# Patient Record
Sex: Male | Born: 1950 | Race: White | Hispanic: No | Smoking: Former smoker
Health system: Southern US, Community
[De-identification: ages and names within clinical notes are randomized; demographics above are authoritative.]

## PROBLEM LIST (undated history)

## (undated) ENCOUNTER — Emergency Department

## (undated) DIAGNOSIS — M1990 Unspecified osteoarthritis, unspecified site: Secondary | ICD-10-CM

## (undated) DIAGNOSIS — E781 Pure hyperglyceridemia: Secondary | ICD-10-CM

## (undated) DIAGNOSIS — K759 Inflammatory liver disease, unspecified: Secondary | ICD-10-CM

## (undated) HISTORY — PX: COLONOSCOPY: SHX174

## (undated) HISTORY — PX: BLADDER STONE REMOVAL: SHX568

## (undated) HISTORY — PX: ABDOMINAL SURGERY: SHX537

## (undated) MED FILL — Fosaprepitant Dimeglumine For IV Infusion 150 MG (Base Eq): INTRAVENOUS | Qty: 5 | Status: AC

---

## 2015-05-25 DIAGNOSIS — M545 Low back pain: Secondary | ICD-10-CM | POA: Diagnosis not present

## 2015-05-25 DIAGNOSIS — Z23 Encounter for immunization: Secondary | ICD-10-CM | POA: Diagnosis not present

## 2015-05-25 DIAGNOSIS — G8929 Other chronic pain: Secondary | ICD-10-CM | POA: Diagnosis not present

## 2015-05-25 DIAGNOSIS — Z0000 Encounter for general adult medical examination without abnormal findings: Secondary | ICD-10-CM | POA: Diagnosis not present

## 2015-05-25 DIAGNOSIS — Z125 Encounter for screening for malignant neoplasm of prostate: Secondary | ICD-10-CM | POA: Diagnosis not present

## 2015-05-29 DIAGNOSIS — E875 Hyperkalemia: Secondary | ICD-10-CM | POA: Diagnosis not present

## 2015-05-31 DIAGNOSIS — E875 Hyperkalemia: Secondary | ICD-10-CM | POA: Diagnosis not present

## 2015-05-31 DIAGNOSIS — R768 Other specified abnormal immunological findings in serum: Secondary | ICD-10-CM | POA: Diagnosis not present

## 2015-06-21 ENCOUNTER — Other Ambulatory Visit: Payer: Self-pay | Admitting: Student

## 2015-06-21 DIAGNOSIS — B182 Chronic viral hepatitis C: Secondary | ICD-10-CM

## 2015-06-21 DIAGNOSIS — Z1211 Encounter for screening for malignant neoplasm of colon: Secondary | ICD-10-CM | POA: Diagnosis not present

## 2015-06-26 ENCOUNTER — Ambulatory Visit: Payer: PPO

## 2015-06-29 ENCOUNTER — Ambulatory Visit
Admission: RE | Admit: 2015-06-29 | Discharge: 2015-06-29 | Disposition: A | Discharge status: Home / Self Care | Payer: PPO | Source: Ambulatory Visit | Attending: Student | Admitting: Student

## 2015-06-29 DIAGNOSIS — K760 Fatty (change of) liver, not elsewhere classified: Secondary | ICD-10-CM | POA: Insufficient documentation

## 2015-06-29 DIAGNOSIS — B182 Chronic viral hepatitis C: Secondary | ICD-10-CM | POA: Insufficient documentation

## 2015-06-30 DIAGNOSIS — B182 Chronic viral hepatitis C: Secondary | ICD-10-CM | POA: Diagnosis not present

## 2015-07-04 DIAGNOSIS — Z1211 Encounter for screening for malignant neoplasm of colon: Secondary | ICD-10-CM | POA: Diagnosis not present

## 2015-07-04 DIAGNOSIS — D125 Benign neoplasm of sigmoid colon: Secondary | ICD-10-CM | POA: Diagnosis not present

## 2015-07-04 DIAGNOSIS — K635 Polyp of colon: Secondary | ICD-10-CM | POA: Diagnosis not present

## 2015-07-04 DIAGNOSIS — D123 Benign neoplasm of transverse colon: Secondary | ICD-10-CM | POA: Diagnosis not present

## 2015-07-04 DIAGNOSIS — D126 Benign neoplasm of colon, unspecified: Secondary | ICD-10-CM | POA: Diagnosis not present

## 2015-07-04 DIAGNOSIS — D122 Benign neoplasm of ascending colon: Secondary | ICD-10-CM | POA: Diagnosis not present

## 2015-07-12 DIAGNOSIS — B182 Chronic viral hepatitis C: Secondary | ICD-10-CM | POA: Diagnosis not present

## 2015-07-26 DIAGNOSIS — B182 Chronic viral hepatitis C: Secondary | ICD-10-CM | POA: Diagnosis not present

## 2015-08-16 DIAGNOSIS — B182 Chronic viral hepatitis C: Secondary | ICD-10-CM | POA: Diagnosis not present

## 2015-09-13 DIAGNOSIS — B182 Chronic viral hepatitis C: Secondary | ICD-10-CM | POA: Diagnosis not present

## 2015-10-31 DIAGNOSIS — M79641 Pain in right hand: Secondary | ICD-10-CM | POA: Diagnosis not present

## 2015-10-31 DIAGNOSIS — Z23 Encounter for immunization: Secondary | ICD-10-CM | POA: Diagnosis not present

## 2015-10-31 DIAGNOSIS — M79642 Pain in left hand: Secondary | ICD-10-CM | POA: Diagnosis not present

## 2015-10-31 DIAGNOSIS — M159 Polyosteoarthritis, unspecified: Secondary | ICD-10-CM | POA: Diagnosis not present

## 2016-01-23 DIAGNOSIS — X32XXXA Exposure to sunlight, initial encounter: Secondary | ICD-10-CM | POA: Diagnosis not present

## 2016-01-23 DIAGNOSIS — D2261 Melanocytic nevi of right upper limb, including shoulder: Secondary | ICD-10-CM | POA: Diagnosis not present

## 2016-01-23 DIAGNOSIS — D2262 Melanocytic nevi of left upper limb, including shoulder: Secondary | ICD-10-CM | POA: Diagnosis not present

## 2016-01-23 DIAGNOSIS — L570 Actinic keratosis: Secondary | ICD-10-CM | POA: Diagnosis not present

## 2016-01-23 DIAGNOSIS — D2271 Melanocytic nevi of right lower limb, including hip: Secondary | ICD-10-CM | POA: Diagnosis not present

## 2016-01-23 DIAGNOSIS — D2272 Melanocytic nevi of left lower limb, including hip: Secondary | ICD-10-CM | POA: Diagnosis not present

## 2016-05-27 DIAGNOSIS — Z0000 Encounter for general adult medical examination without abnormal findings: Secondary | ICD-10-CM | POA: Diagnosis not present

## 2016-07-01 ENCOUNTER — Other Ambulatory Visit: Payer: Self-pay | Admitting: Student

## 2016-07-01 DIAGNOSIS — B182 Chronic viral hepatitis C: Secondary | ICD-10-CM | POA: Diagnosis not present

## 2016-07-05 ENCOUNTER — Ambulatory Visit
Admission: RE | Admit: 2016-07-05 | Discharge: 2016-07-05 | Disposition: A | Discharge status: Home / Self Care | Payer: PPO | Source: Ambulatory Visit | Attending: Student | Admitting: Student

## 2016-07-05 DIAGNOSIS — B1920 Unspecified viral hepatitis C without hepatic coma: Secondary | ICD-10-CM | POA: Diagnosis not present

## 2016-07-05 DIAGNOSIS — B182 Chronic viral hepatitis C: Secondary | ICD-10-CM | POA: Diagnosis not present

## 2016-11-25 DIAGNOSIS — M159 Polyosteoarthritis, unspecified: Secondary | ICD-10-CM | POA: Diagnosis not present

## 2016-11-25 DIAGNOSIS — Z23 Encounter for immunization: Secondary | ICD-10-CM | POA: Diagnosis not present

## 2017-01-22 DIAGNOSIS — X32XXXA Exposure to sunlight, initial encounter: Secondary | ICD-10-CM | POA: Diagnosis not present

## 2017-01-22 DIAGNOSIS — D2262 Melanocytic nevi of left upper limb, including shoulder: Secondary | ICD-10-CM | POA: Diagnosis not present

## 2017-01-22 DIAGNOSIS — D225 Melanocytic nevi of trunk: Secondary | ICD-10-CM | POA: Diagnosis not present

## 2017-01-22 DIAGNOSIS — L821 Other seborrheic keratosis: Secondary | ICD-10-CM | POA: Diagnosis not present

## 2017-05-26 DIAGNOSIS — Z0000 Encounter for general adult medical examination without abnormal findings: Secondary | ICD-10-CM | POA: Diagnosis not present

## 2017-05-28 DIAGNOSIS — E7800 Pure hypercholesterolemia, unspecified: Secondary | ICD-10-CM | POA: Diagnosis not present

## 2017-05-28 DIAGNOSIS — Z0000 Encounter for general adult medical examination without abnormal findings: Secondary | ICD-10-CM | POA: Diagnosis not present

## 2017-06-03 IMAGING — US US [PERSON_NAME]
1 series · 13 of 14 positions shown · non-contrast
Comparison: None.

CLINICAL DATA: Chronic hepatitis-C.



[Series 1: us (person_name) · 0.23mm/px · 13 of 14 slices shown]
[im 1/14]
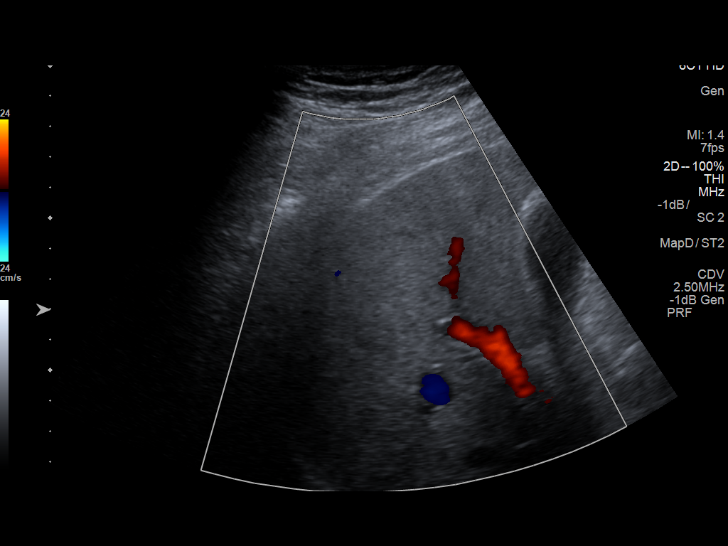
[im 2/14]
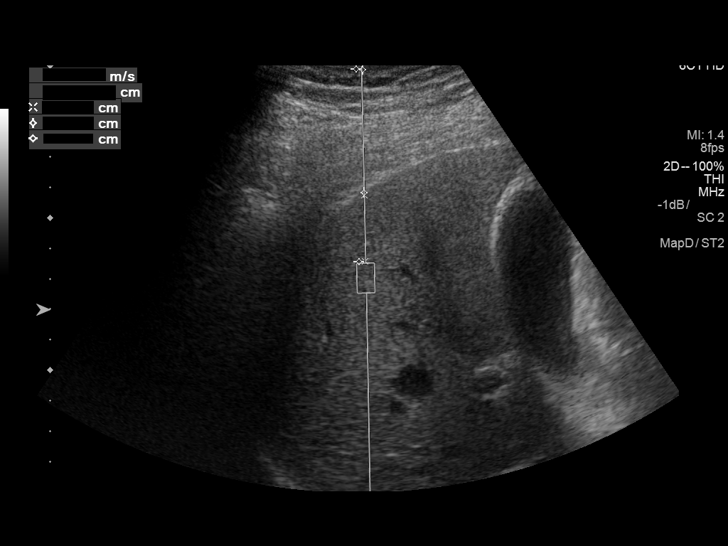
[im 3/14]
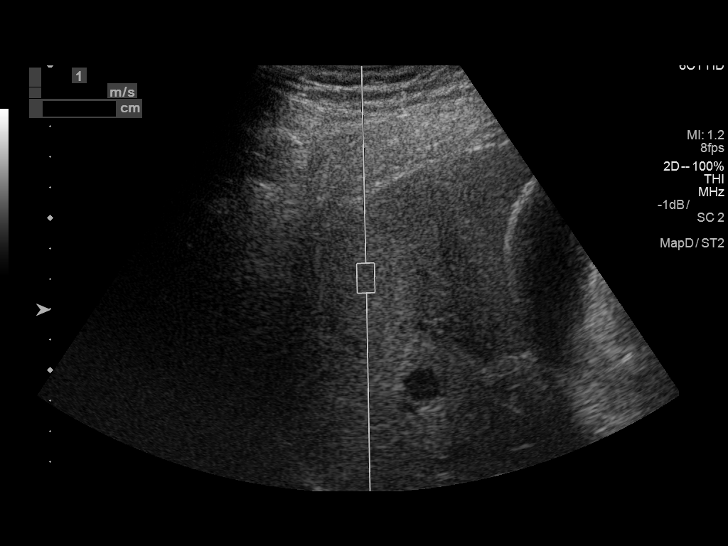
[im 4/14]
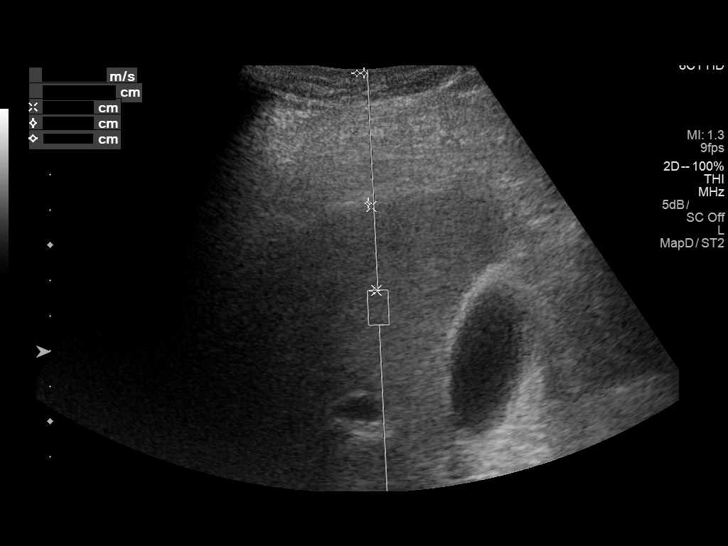
[im 5/14]
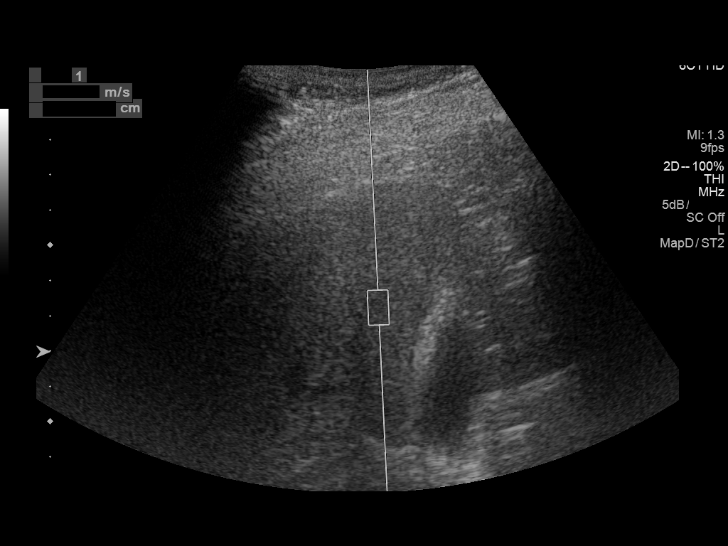
[im 6/14]
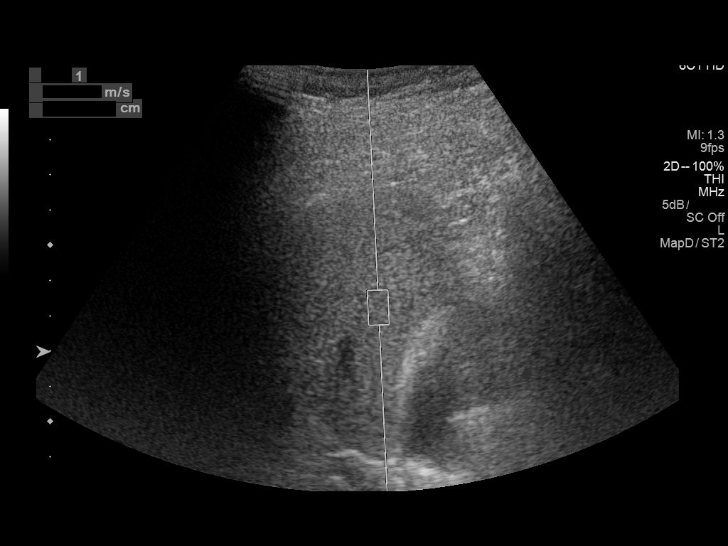
[im 8/14]
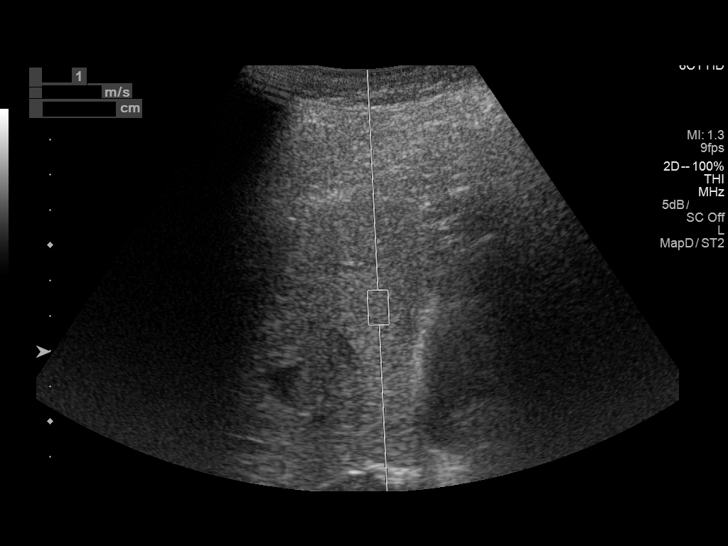
[im 9/14]
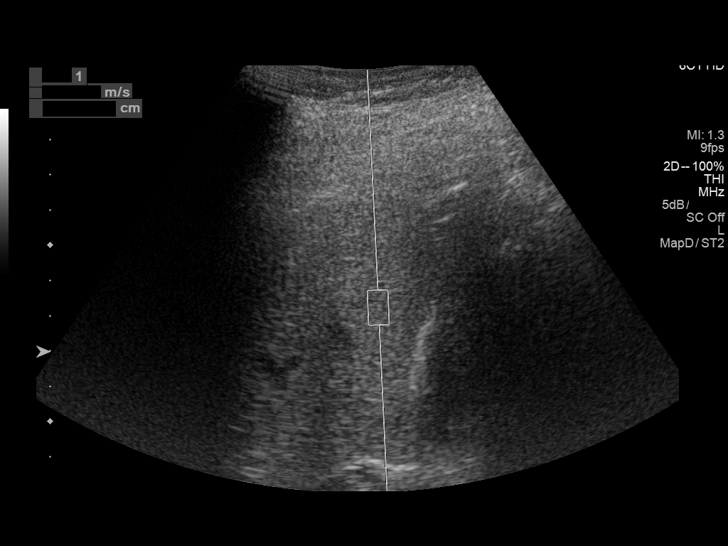
[im 10/14]
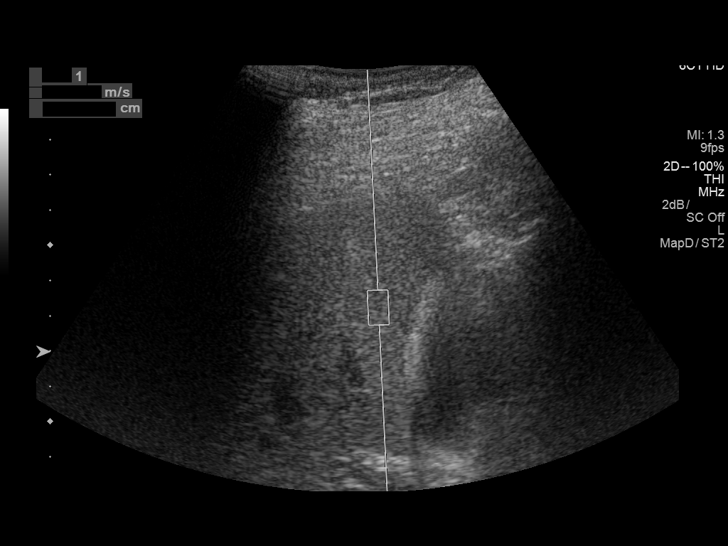
[im 11/14]
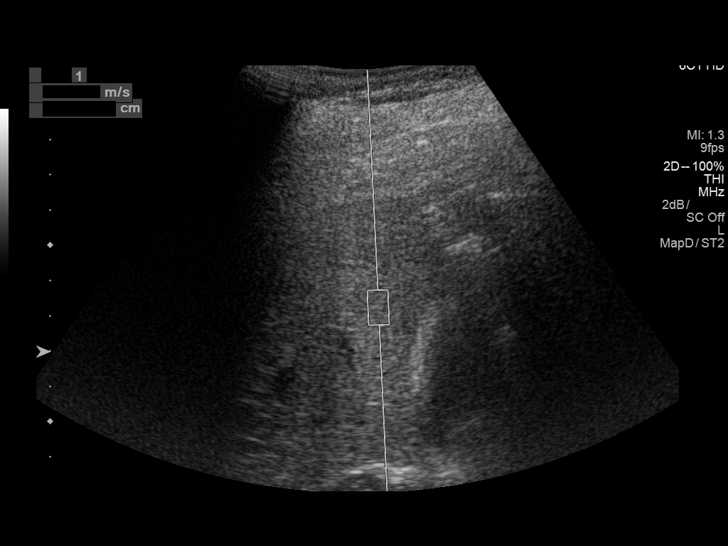
[im 12/14]
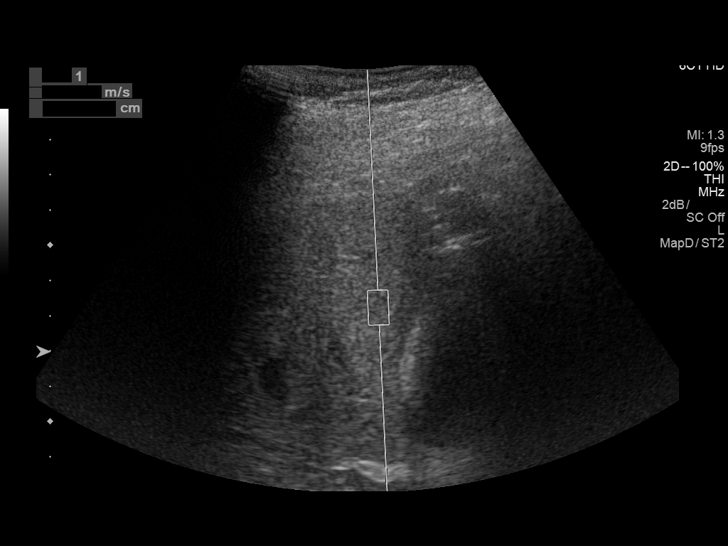
[im 13/14]
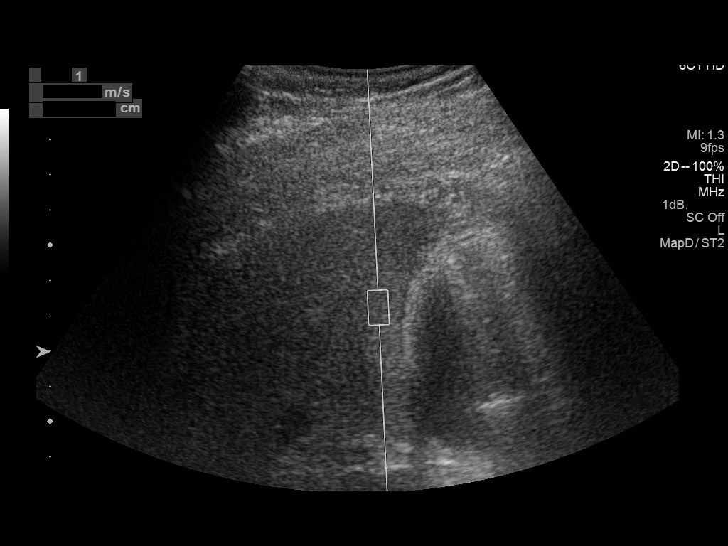
[im 14/14]
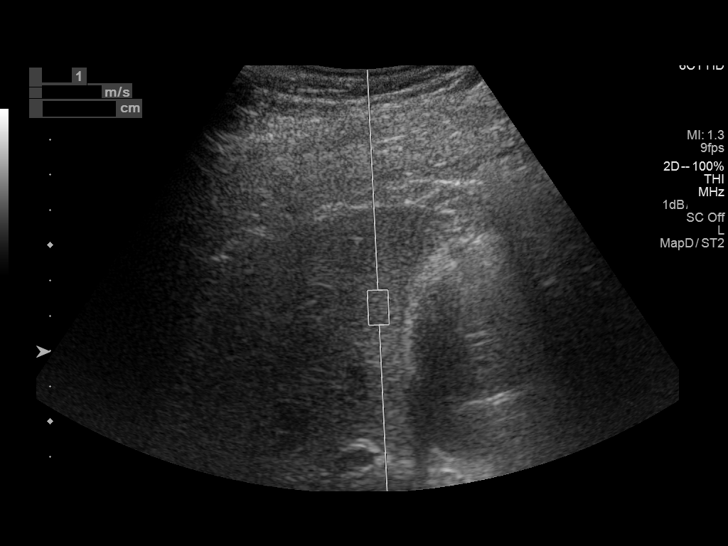

[13 of 14 positions shown; findings below may reference images not displayed]

FINDINGS: ULTRASOUND ABDOMEN

Gallbladder: No gallstones or wall thickening visualized. No
sonographic Murphy sign noted by sonographer.

Common bile duct: Diameter: 3 mm

Liver: Liver parenchyma is diffusely mildly echogenic with mild
posterior acoustic attenuation, in keeping with mild diffuse hepatic
steatosis. No liver surface irregularity or liver mass are
demonstrated, noting decreased sensitivity for a liver mass in the
setting of an echogenic liver.

IVC: No abnormality visualized.

Pancreas: Obscured by significant overlying bowel gas.

Spleen: Size and appearance within normal limits.

Right Kidney: Length: 10.1 cm. Echogenicity within normal limits. No
mass or hydronephrosis visualized.

Left Kidney: Length: 11.6 cm. Echogenicity within normal limits. No
mass or hydronephrosis visualized.

Abdominal aorta: No aneurysm visualized.

Other findings: None.

ULTRASOUND HEPATIC ELASTOGRAPHY

Device: Siemens Helix VTQ

Patient position:  Left Lateral Decubitus

Transducer 6C1

Number of measurements:  10

Hepatic Segment:  8

Median velocity:   0.85  m/sec

IQR:

IQR/Median velocity ratio 0.2 cm

Corresponding Metavir fibrosis score:  F0/F1

Risk of fibrosis: Minimal

Limitations of exam: None

Pertinent findings noted on other imaging exams:  None

Please note that abnormal shear wave velocities may also be
identified in clinical settings other than with hepatic fibrosis,
such as: acute hepatitis, elevated right heart and central venous
pressures including use of beta blockers, Zapeta disease
(Yannick), infiltrative processes such as
mastocytosis/amyloidosis/infiltrative tumor, extrahepatic
cholestasis, in the post-prandial state, and liver transplantation.
Correlation with patient history, laboratory data, and clinical
condition recommended.
IMPRESSION: 1. Mild diffuse hepatic steatosis. No macroscopic evidence of
cirrhosis. No liver mass .
2. Otherwise normal abdominal sonogram.
3. Hepatic elastography results:
Median hepatic shear wave velocity is calculated at 0.85 m/sec.

Corresponding Metavir fibrosis score is F0/F1.

Risk of fibrosis is minimal.

Follow-up:  None required.

## 2017-09-29 DIAGNOSIS — E7800 Pure hypercholesterolemia, unspecified: Secondary | ICD-10-CM | POA: Diagnosis not present

## 2017-10-06 DIAGNOSIS — E781 Pure hyperglyceridemia: Secondary | ICD-10-CM | POA: Diagnosis not present

## 2017-10-06 DIAGNOSIS — M159 Polyosteoarthritis, unspecified: Secondary | ICD-10-CM | POA: Diagnosis not present

## 2018-01-22 DIAGNOSIS — D2271 Melanocytic nevi of right lower limb, including hip: Secondary | ICD-10-CM | POA: Diagnosis not present

## 2018-01-22 DIAGNOSIS — X32XXXA Exposure to sunlight, initial encounter: Secondary | ICD-10-CM | POA: Diagnosis not present

## 2018-01-22 DIAGNOSIS — D2272 Melanocytic nevi of left lower limb, including hip: Secondary | ICD-10-CM | POA: Diagnosis not present

## 2018-01-22 DIAGNOSIS — D2261 Melanocytic nevi of right upper limb, including shoulder: Secondary | ICD-10-CM | POA: Diagnosis not present

## 2018-01-22 DIAGNOSIS — L570 Actinic keratosis: Secondary | ICD-10-CM | POA: Diagnosis not present

## 2018-01-22 DIAGNOSIS — D2262 Melanocytic nevi of left upper limb, including shoulder: Secondary | ICD-10-CM | POA: Diagnosis not present

## 2018-01-22 DIAGNOSIS — L821 Other seborrheic keratosis: Secondary | ICD-10-CM | POA: Diagnosis not present

## 2018-01-22 DIAGNOSIS — D225 Melanocytic nevi of trunk: Secondary | ICD-10-CM | POA: Diagnosis not present

## 2018-04-06 DIAGNOSIS — E781 Pure hyperglyceridemia: Secondary | ICD-10-CM | POA: Diagnosis not present

## 2018-04-06 DIAGNOSIS — M159 Polyosteoarthritis, unspecified: Secondary | ICD-10-CM | POA: Diagnosis not present

## 2018-04-13 DIAGNOSIS — Z0000 Encounter for general adult medical examination without abnormal findings: Secondary | ICD-10-CM | POA: Diagnosis not present

## 2018-04-13 DIAGNOSIS — M159 Polyosteoarthritis, unspecified: Secondary | ICD-10-CM | POA: Diagnosis not present

## 2018-04-13 DIAGNOSIS — E781 Pure hyperglyceridemia: Secondary | ICD-10-CM | POA: Diagnosis not present

## 2018-06-10 IMAGING — US US ABDOMEN COMPLETE
1 series · 14 of 25 positions shown · non-contrast
Comparison: None.

CLINICAL DATA: Hepatitis-C.

EXAM:
ABDOMEN ULTRASOUND COMPLETE

[Series 1: us abdomen complete · 0.23mm/px · 14 of 106 slices shown]
[im 1/106]
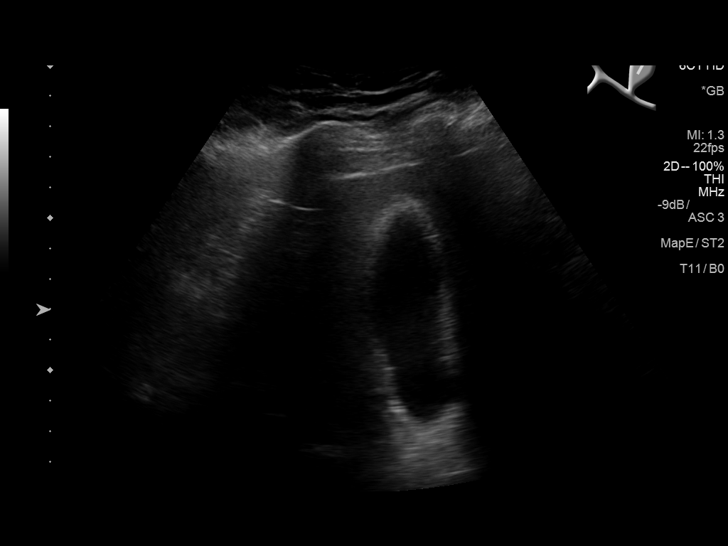
[im 9/106]
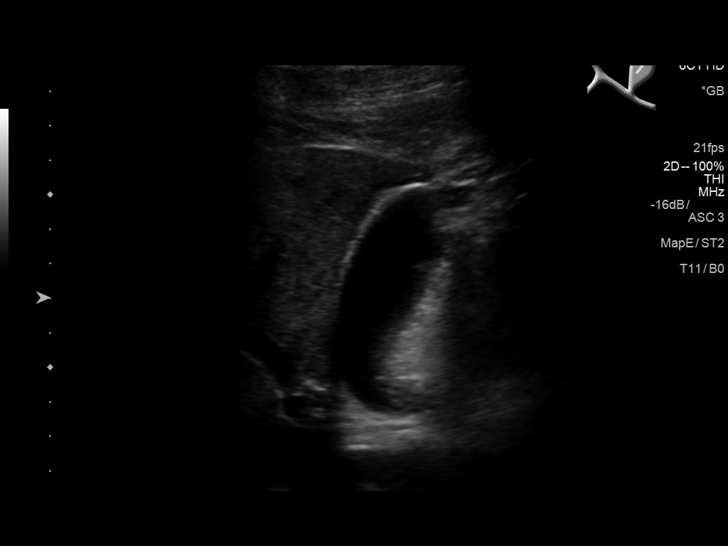
[im 18/106]
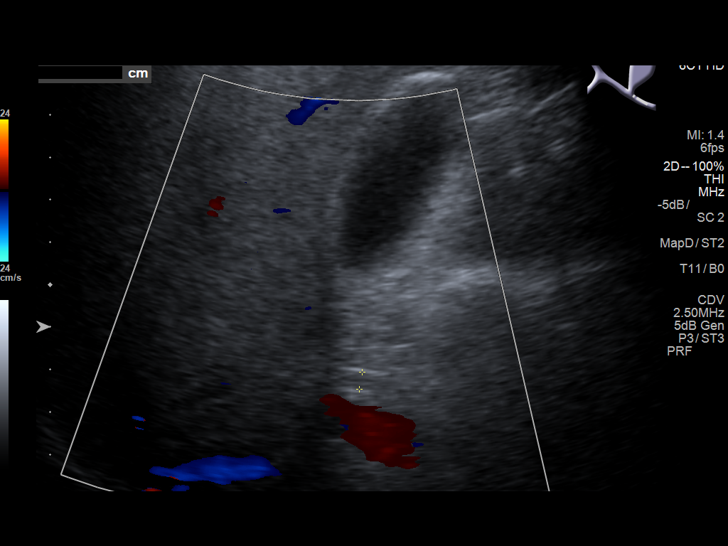
[im 27/106]
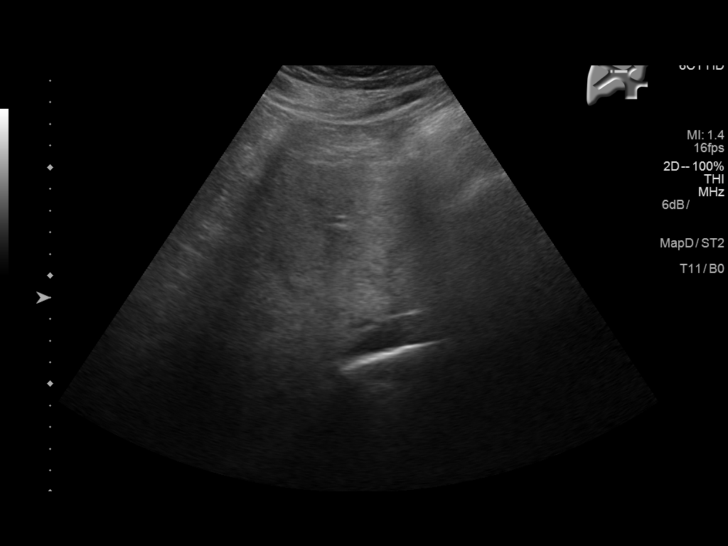
[im 36/106]
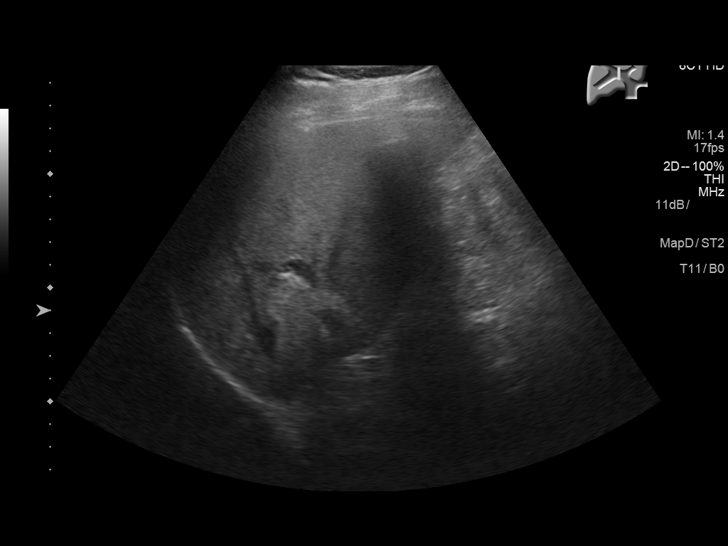
[im 40/106]
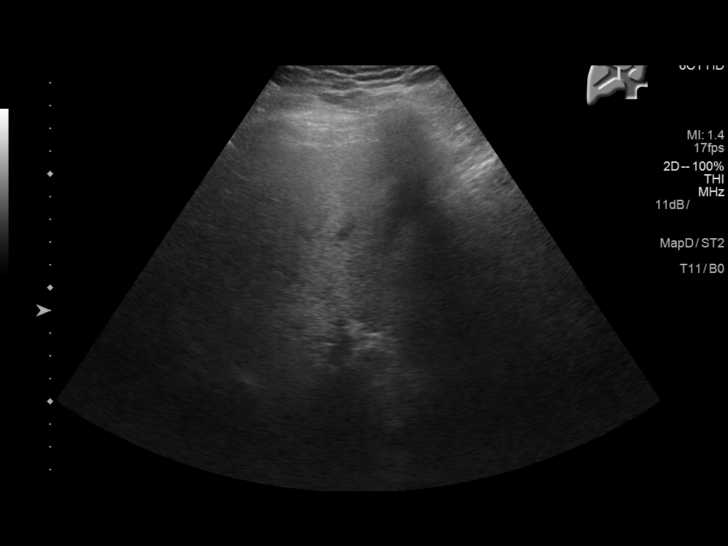
[im 49/106]
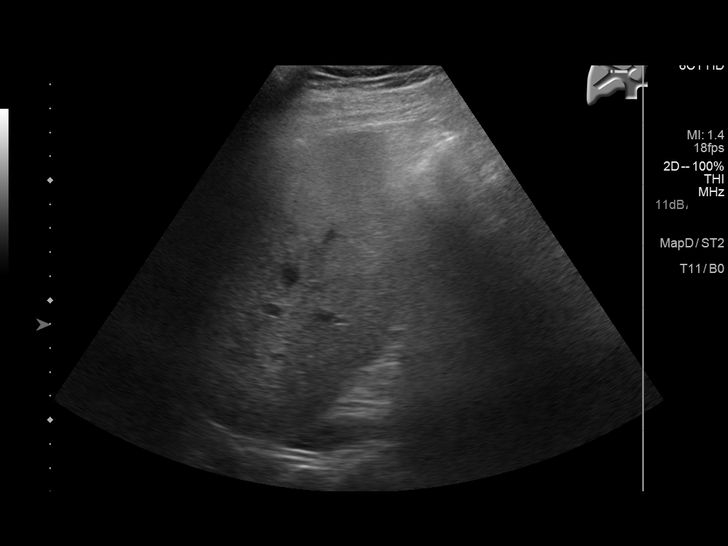
[im 57/106]
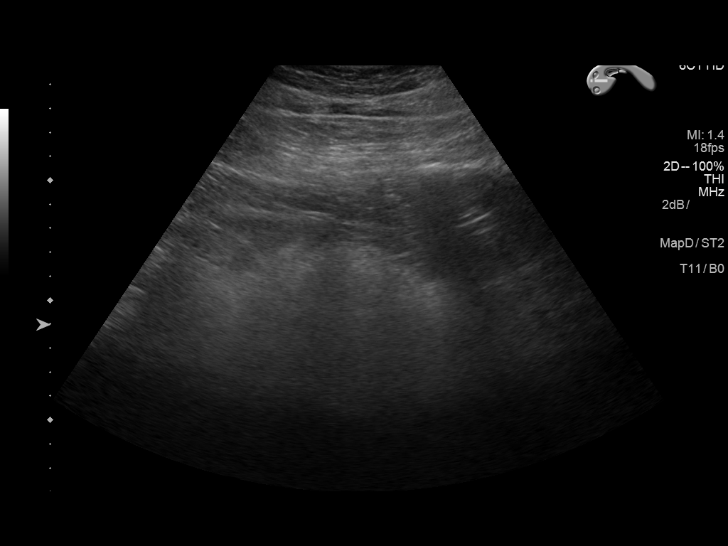
[im 66/106]
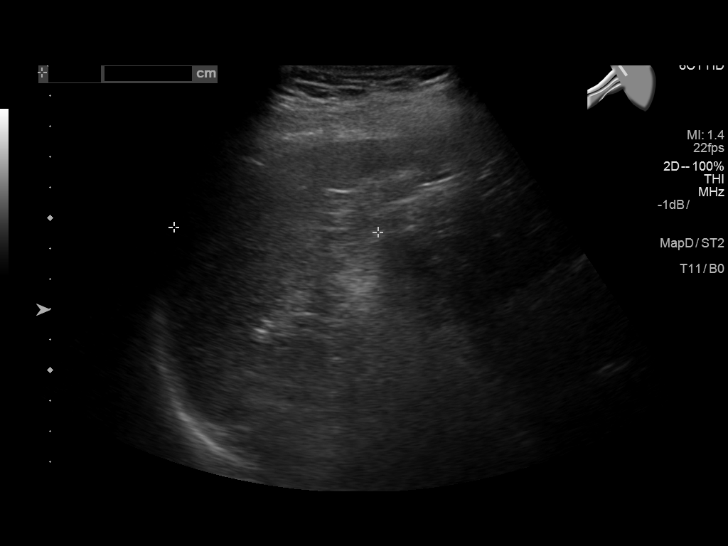
[im 71/106]
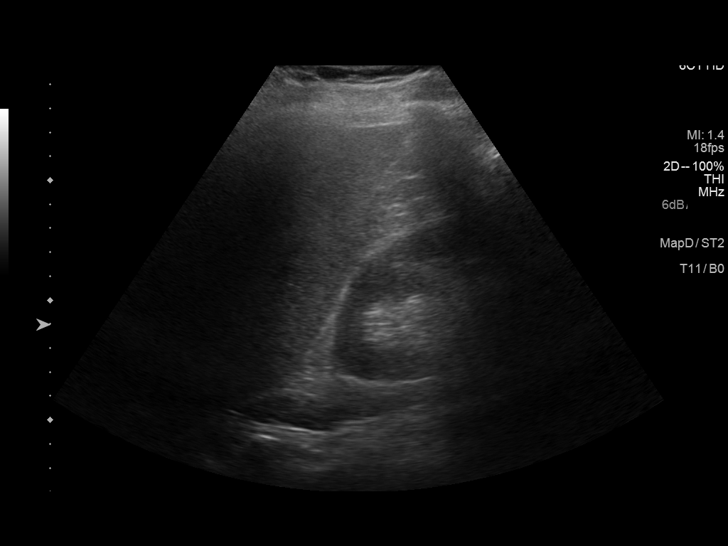
[im 79/106]
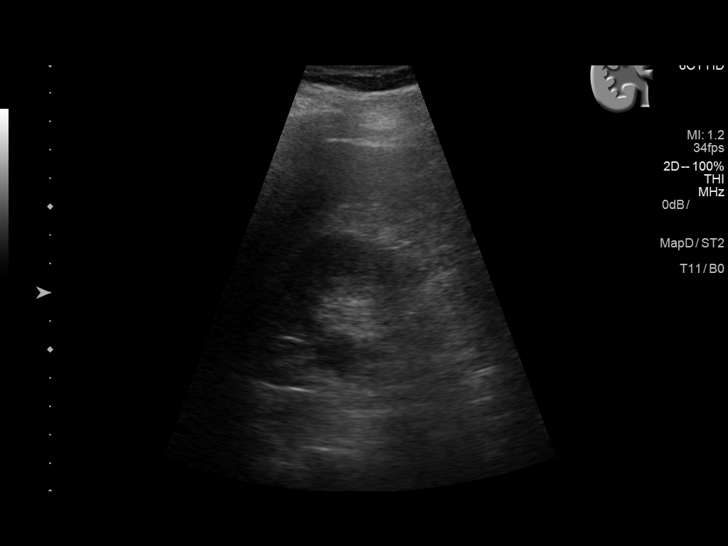
[im 88/106]
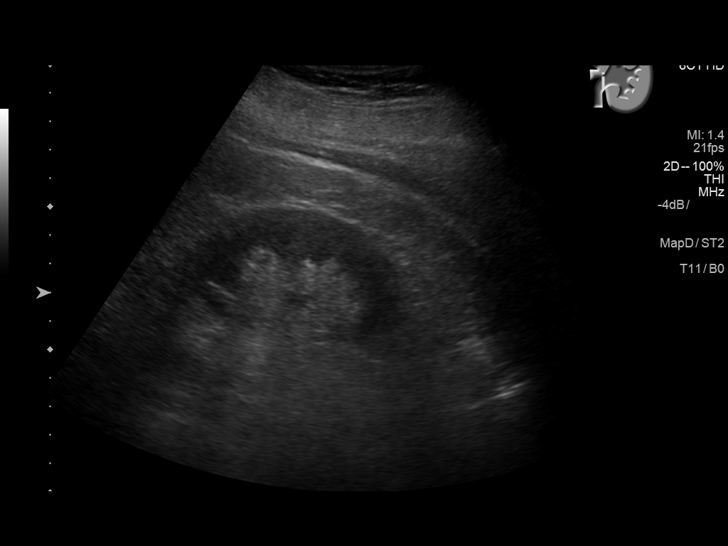
[im 97/106]
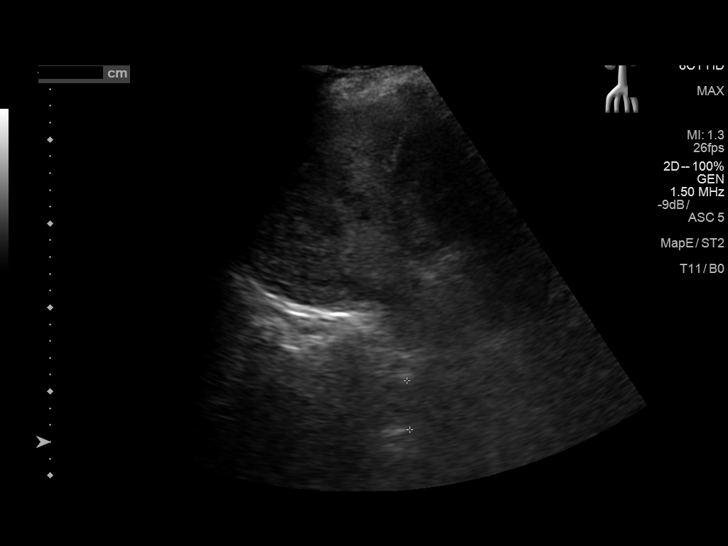
[im 106/106]
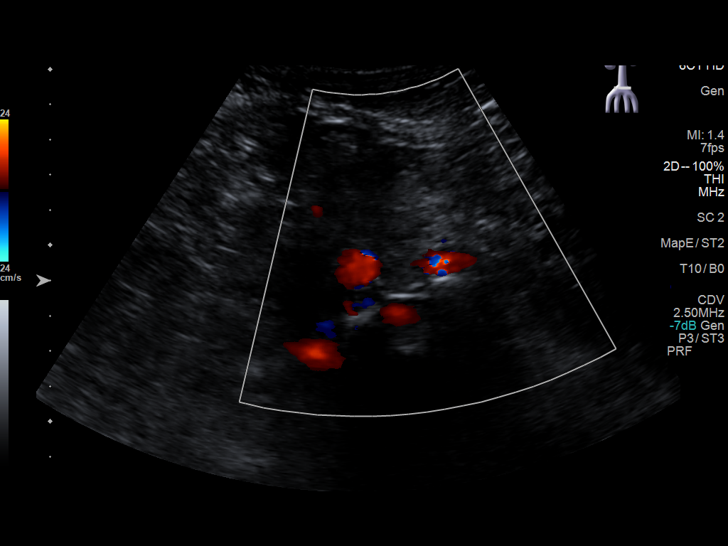

[14 of 25 positions shown; findings below may reference images not displayed]

FINDINGS: Gallbladder: No gallstones or wall thickening visualized. No
sonographic Murphy sign noted by sonographer.

Common bile duct: Diameter: 4.2 mm, normal.

Liver: No focal lesion identified. Slight diffuse increased
echogenicity of the liver parenchyma, unchanged.

IVC: No abnormality visualized.

Pancreas: Visualized portion unremarkable.

Spleen: Size and appearance within normal limits.

Right Kidney: Length: 10.6 cm . Echogenicity within normal limits.
No mass or hydronephrosis visualized.

Left Kidney: Length: 11.1 cm. Echogenicity within normal limits. No
mass or hydronephrosis visualized.

Abdominal aorta: No aneurysm visualized.  Aortic atherosclerosis.

Other findings: None.
IMPRESSION: No acute abnormality.  No focal liver lesions.

## 2018-08-28 DIAGNOSIS — Z8619 Personal history of other infectious and parasitic diseases: Secondary | ICD-10-CM | POA: Diagnosis not present

## 2018-08-28 DIAGNOSIS — Z86010 Personal history of colonic polyps: Secondary | ICD-10-CM | POA: Diagnosis not present

## 2018-10-08 DIAGNOSIS — E781 Pure hyperglyceridemia: Secondary | ICD-10-CM | POA: Diagnosis not present

## 2018-10-08 DIAGNOSIS — M159 Polyosteoarthritis, unspecified: Secondary | ICD-10-CM | POA: Diagnosis not present

## 2018-10-15 ENCOUNTER — Other Ambulatory Visit: Payer: Self-pay | Admitting: Family Medicine

## 2018-10-15 DIAGNOSIS — E781 Pure hyperglyceridemia: Secondary | ICD-10-CM | POA: Diagnosis not present

## 2018-10-15 DIAGNOSIS — Z87891 Personal history of nicotine dependence: Secondary | ICD-10-CM | POA: Diagnosis not present

## 2018-10-15 DIAGNOSIS — Z0000 Encounter for general adult medical examination without abnormal findings: Secondary | ICD-10-CM

## 2018-10-16 ENCOUNTER — Other Ambulatory Visit
Admission: RE | Admit: 2018-10-16 | Discharge: 2018-10-16 | Disposition: A | Discharge status: Home / Self Care | Payer: PPO | Source: Ambulatory Visit | Attending: Internal Medicine | Admitting: Internal Medicine

## 2018-10-16 ENCOUNTER — Other Ambulatory Visit: Payer: Self-pay

## 2018-10-16 DIAGNOSIS — Z20828 Contact with and (suspected) exposure to other viral communicable diseases: Secondary | ICD-10-CM | POA: Diagnosis not present

## 2018-10-16 DIAGNOSIS — Z86010 Personal history of colonic polyps: Secondary | ICD-10-CM | POA: Insufficient documentation

## 2018-10-16 DIAGNOSIS — Z01812 Encounter for preprocedural laboratory examination: Secondary | ICD-10-CM | POA: Insufficient documentation

## 2018-10-16 LAB — SARS CORONAVIRUS 2 (TAT 6-24 HRS): SARS Coronavirus 2: NEGATIVE

## 2018-10-20 ENCOUNTER — Encounter: Payer: Self-pay | Admitting: *Deleted

## 2018-10-21 ENCOUNTER — Ambulatory Visit: Payer: PPO | Admitting: Certified Registered Nurse Anesthetist

## 2018-10-21 ENCOUNTER — Ambulatory Visit
Admission: RE | Admit: 2018-10-21 | Discharge: 2018-10-21 | Disposition: A | Discharge status: Home / Self Care | Payer: PPO | Attending: Internal Medicine | Admitting: Internal Medicine

## 2018-10-21 ENCOUNTER — Encounter: Payer: Self-pay | Admitting: *Deleted

## 2018-10-21 ENCOUNTER — Encounter
Admission: RE | Disposition: A | Discharge status: Home / Self Care | Payer: Self-pay | Source: Home / Self Care | Attending: Internal Medicine

## 2018-10-21 ENCOUNTER — Other Ambulatory Visit: Payer: Self-pay

## 2018-10-21 DIAGNOSIS — Z1211 Encounter for screening for malignant neoplasm of colon: Secondary | ICD-10-CM | POA: Diagnosis not present

## 2018-10-21 DIAGNOSIS — K5730 Diverticulosis of large intestine without perforation or abscess without bleeding: Secondary | ICD-10-CM | POA: Diagnosis not present

## 2018-10-21 DIAGNOSIS — E781 Pure hyperglyceridemia: Secondary | ICD-10-CM | POA: Diagnosis not present

## 2018-10-21 DIAGNOSIS — Z8619 Personal history of other infectious and parasitic diseases: Secondary | ICD-10-CM | POA: Insufficient documentation

## 2018-10-21 DIAGNOSIS — Z86010 Personal history of colonic polyps: Secondary | ICD-10-CM | POA: Diagnosis not present

## 2018-10-21 DIAGNOSIS — K648 Other hemorrhoids: Secondary | ICD-10-CM | POA: Diagnosis not present

## 2018-10-21 DIAGNOSIS — M1990 Unspecified osteoarthritis, unspecified site: Secondary | ICD-10-CM | POA: Diagnosis not present

## 2018-10-21 DIAGNOSIS — D120 Benign neoplasm of cecum: Secondary | ICD-10-CM | POA: Insufficient documentation

## 2018-10-21 DIAGNOSIS — Z791 Long term (current) use of non-steroidal anti-inflammatories (NSAID): Secondary | ICD-10-CM | POA: Diagnosis not present

## 2018-10-21 DIAGNOSIS — K640 First degree hemorrhoids: Secondary | ICD-10-CM | POA: Diagnosis not present

## 2018-10-21 DIAGNOSIS — K635 Polyp of colon: Secondary | ICD-10-CM | POA: Diagnosis not present

## 2018-10-21 DIAGNOSIS — K5790 Diverticulosis of intestine, part unspecified, without perforation or abscess without bleeding: Secondary | ICD-10-CM | POA: Diagnosis not present

## 2018-10-21 HISTORY — DX: Pure hyperglyceridemia: E78.1

## 2018-10-21 HISTORY — DX: Inflammatory liver disease, unspecified: K75.9

## 2018-10-21 HISTORY — DX: Unspecified osteoarthritis, unspecified site: M19.90

## 2018-10-21 HISTORY — PX: COLONOSCOPY WITH PROPOFOL: SHX5780

## 2018-10-21 SURGERY — COLONOSCOPY WITH PROPOFOL
Anesthesia: General

## 2018-10-21 MED ORDER — PROPOFOL 10 MG/ML IV BOLUS
INTRAVENOUS | Status: DC | PRN
  Administered 2018-10-21 (×2): 40 mg via INTRAVENOUS
  Administered 2018-10-21: 30 mg via INTRAVENOUS
  Administered 2018-10-21: 100 mg via INTRAVENOUS
  Administered 2018-10-21: 30 mg via INTRAVENOUS

## 2018-10-21 MED ORDER — LIDOCAINE HCL (PF) 1 % IJ SOLN
INTRAMUSCULAR | Status: AC
  Filled 2018-10-21: qty 2

## 2018-10-21 MED ORDER — PHENYLEPHRINE HCL (PRESSORS) 10 MG/ML IV SOLN
INTRAVENOUS | Status: DC | PRN
  Administered 2018-10-21 (×2): 100 ug via INTRAVENOUS

## 2018-10-21 MED ORDER — PROPOFOL 10 MG/ML IV BOLUS
INTRAVENOUS | Status: AC
  Filled 2018-10-21: qty 80

## 2018-10-21 MED ORDER — LIDOCAINE HCL (CARDIAC) PF 100 MG/5ML IV SOSY
PREFILLED_SYRINGE | INTRAVENOUS | Status: DC | PRN
  Administered 2018-10-21: 100 mg via INTRAVENOUS

## 2018-10-21 MED ORDER — SODIUM CHLORIDE 0.9 % IV SOLN
INTRAVENOUS | Status: DC
  Administered 2018-10-21: 09:00:00 via INTRAVENOUS

## 2018-10-21 NOTE — Anesthesia Preprocedure Evaluation (Signed)
Anesthesia Evaluation  Patient identified by MRN, date of birth, ID band Patient awake    Reviewed: Allergy & Precautions, H&P , NPO status , Patient's Chart, lab work & pertinent test results, reviewed documented beta blocker date and time   Airway Mallampati: II   Neck ROM: full    Dental  (+) Poor Dentition   Pulmonary neg pulmonary ROS, former smoker,    Pulmonary exam normal        Cardiovascular Exercise Tolerance: Good negative cardio ROS Normal cardiovascular exam Rhythm:regular Rate:Normal     Neuro/Psych negative neurological ROS  negative psych ROS   GI/Hepatic negative GI ROS, (+) Hepatitis -  Endo/Other  negative endocrine ROS  Renal/GU negative Renal ROS  negative genitourinary   Musculoskeletal   Abdominal   Peds  Hematology negative hematology ROS (+)   Anesthesia Other Findings Past Medical History: No date: Arthritis No date: Hepatitis     Comment:  HX.OF HEP C No date: Hypertriglyceridemia Past Surgical History: No date: BLADDER STONE REMOVAL     Comment:  AGE 68 No date: COLONOSCOPY BMI    Body Mass Index: 28.35 kg/m     Reproductive/Obstetrics negative OB ROS                             Anesthesia Physical Anesthesia Plan  ASA: II  Anesthesia Plan: General   Post-op Pain Management:    Induction:   PONV Risk Score and Plan:   Airway Management Planned:   Additional Equipment:   Intra-op Plan:   Post-operative Plan:   Informed Consent: I have reviewed the patients History and Physical, chart, labs and discussed the procedure including the risks, benefits and alternatives for the proposed anesthesia with the patient or authorized representative who has indicated his/her understanding and acceptance.     Dental Advisory Given  Plan Discussed with: CRNA  Anesthesia Plan Comments:         Anesthesia Quick Evaluation

## 2018-10-21 NOTE — Interval H&P Note (Signed)
History and Physical Interval Note:  10/21/2018 9:16 AM  Curtis Cooper  has presented today for surgery, with the diagnosis of PERSONAL HX.OF COLON POLYPS.  The various methods of treatment have been discussed with the patient and family. After consideration of risks, benefits and other options for treatment, the patient has consented to  Procedure(s): COLONOSCOPY WITH PROPOFOL (N/A) as a surgical intervention.  The patient's history has been reviewed, patient examined, no change in status, stable for surgery.  I have reviewed the patient's chart and labs.  Questions were answered to the patient's satisfaction.     [place], [place]

## 2018-10-21 NOTE — Anesthesia Post-op Follow-up Note (Signed)
Anesthesia QCDR form completed.        

## 2018-10-21 NOTE — Op Note (Signed)
[facility] Gastroenterology Patient Name: Curtis Cooper Procedure Date: 10/21/2018 9:15 AM MRN: [id number] Account #: [id number] Date of Birth: 21-May-1950 Admit Type: Outpatient Age: 68 Room: [facility] ENDO ROOM 3 Gender: Male Note Status: Finalized Procedure:            Colonoscopy Indications:          High risk colon cancer surveillance: Personal history                        of colonic polyps Providers:            Benay Pike. Annikah Lovins MD, MD Medicines:            Propofol per Anesthesia Complications:        No immediate complications. Procedure:            Pre-Anesthesia Assessment:                       - The risks and benefits of the procedure and the                        sedation options and risks were discussed with the                        patient. All questions were answered and informed                        consent was obtained.                       - Patient identification and proposed procedure were                        verified prior to the procedure by the nurse. The                        procedure was verified in the procedure room.                       - ASA Grade Assessment: III - A patient with severe                        systemic disease.                       - After reviewing the risks and benefits, the patient                        was deemed in satisfactory condition to undergo the                        procedure.                       After obtaining informed consent, the colonoscope was                        passed under direct vision. Throughout the procedure,                        the patient's blood pressure, pulse, and oxygen  saturations were monitored continuously. The                        Colonoscope was introduced through the anus and                        advanced to the the cecum, identified by appendiceal                        orifice and ileocecal valve. The colonoscopy was            performed without difficulty. The patient tolerated the                        procedure well. The quality of the bowel preparation                        was excellent. The ileocecal valve, appendiceal                        orifice, and rectum were photographed. Findings:      The perianal and digital rectal examinations were normal. Pertinent       negatives include normal sphincter tone and no palpable rectal lesions.      A few small-mouthed diverticula were found in the sigmoid colon.      A 4 mm polyp was found in the cecum. The polyp was sessile. The polyp       was removed with a jumbo cold forceps. Resection and retrieval were       complete.      Non-bleeding internal hemorrhoids were found during retroflexion. The       hemorrhoids were Grade I (internal hemorrhoids that do not prolapse).      The exam was otherwise without abnormality. Impression:           - Diverticulosis in the sigmoid colon.                       - One 4 mm polyp in the cecum, removed with a jumbo                        cold forceps. Resected and retrieved.                       - Non-bleeding internal hemorrhoids.                       - The examination was otherwise normal. Recommendation:       - Patient has a contact number available for                        emergencies. The signs and symptoms of potential                        delayed complications were discussed with the patient.                        Return to normal activities tomorrow. Written discharge                        instructions were provided to the patient.                       -  Resume previous diet.                       - Continue present medications.                       - Await pathology results.                       - Repeat colonoscopy in 5 years for surveillance.                       - The findings and recommendations were discussed with                        the patient.                       - Return to GI  office PRN. Procedure Code(s):    --- Professional ---                       [id number], Colonoscopy, flexible; with biopsy, single or                        multiple Diagnosis Code(s):    --- Professional ---                       K57.30, Diverticulosis of large intestine without                        perforation or abscess without bleeding                       K63.5, Polyp of colon                       K64.0, First degree hemorrhoids                       Z86.010, Personal history of colonic polyps CPT copyright 2019 American Medical Association. All rights reserved. The codes documented in this report are preliminary and upon coder review may  be revised to meet current compliance requirements. Efrain Sella MD, MD 10/21/2018 9:43:44 AM This report has been signed electronically. Number of Addenda: 0 Note Initiated On: 10/21/2018 9:15 AM Scope Withdrawal Time: 0 hours 6 minutes 11 seconds  Total Procedure Duration: 0 hours 7 minutes 56 seconds  Estimated Blood Loss: Estimated blood loss: none.      [facility]

## 2018-10-21 NOTE — Transfer of Care (Signed)
Immediate Anesthesia Transfer of Care Note  Patient: Curtis Cooper  Procedure(s) Performed: COLONOSCOPY WITH PROPOFOL (N/A )  Patient Location: PACU and Endoscopy Unit  Anesthesia Type:General  Level of Consciousness: drowsy and patient cooperative  Airway & Oxygen Therapy: Patient Spontanous Breathing  Post-op Assessment: Report given to RN and Post -op Vital signs reviewed and stable  Post vital signs: Reviewed and stable  Last Vitals:  Vitals Value Taken Time  BP 88/51 10/21/18 0945  Temp    Pulse 68 10/21/18 0947  Resp 19 10/21/18 0947  SpO2 97 % 10/21/18 0947  Vitals shown include unvalidated device data.  Last Pain:  Vitals:   10/21/18 0945  TempSrc: (P) Tympanic         Complications: No apparent anesthesia complications

## 2018-10-21 NOTE — H&P (Signed)
  Outpatient short stay form Pre-procedure 10/21/2018 9:14 AM Favor Hackler K. Alice Reichert, M.D.  Primary Physician: Meriel Flavors, M.D.  Reason for visit:  Personal hx of adenomatous colon polyps  History of present illness:                           Patient presents for colonoscopy for a personal hx of colon polyps. The patient denies abdominal pain, abnormal weight loss or rectal bleeding.     Current Facility-Administered Medications:  .  0.9 %  sodium chloride infusion, , Intravenous, Continuous, [place], Benay Pike, MD, Last Rate: 20 mL/hr at 10/21/18 0834 .  lidocaine (PF) (XYLOCAINE) 1 % injection, , , ,   Medications Prior to Admission  Medication Sig Dispense Refill Last Dose  . celecoxib (CELEBREX) 200 MG capsule Take 200 mg by mouth 2 (two) times daily.   Past Week at Unknown time  . fenofibrate (TRICOR) 145 MG tablet Take 145 mg by mouth daily.   10/20/2018 at Unknown time     Not on File   Past Medical History:  Diagnosis Date  . Arthritis   . Hepatitis    HX.OF HEP C  . Hypertriglyceridemia     Review of systems:  Otherwise negative.    Physical Exam  Gen: Alert, oriented. Appears stated age.  HEENT: [place]/AT. PERRLA. Lungs: CTA, no wheezes. CV: RR nl S1, S2. Abd: soft, benign, no masses. BS+ Ext: No edema. Pulses 2+    Planned procedures: Proceed with colonoscopy. The patient understands the nature of the planned procedure, indications, risks, alternatives and potential complications including but not limited to bleeding, infection, perforation, damage to internal organs and possible oversedation/side effects from anesthesia. The patient agrees and gives consent to proceed.  Please refer to procedure notes for findings, recommendations and patient disposition/instructions.     Ramzi Brathwaite K. Alice Reichert, M.D. Gastroenterology 10/21/2018  9:14 AM

## 2018-10-22 ENCOUNTER — Encounter: Payer: Self-pay | Admitting: Internal Medicine

## 2018-10-22 LAB — SURGICAL PATHOLOGY

## 2018-10-22 NOTE — Anesthesia Postprocedure Evaluation (Signed)
Anesthesia Post Note  Patient: Curtis Cooper  Procedure(s) Performed: COLONOSCOPY WITH PROPOFOL (N/A )  Patient location during evaluation: PACU Anesthesia Type: General Level of consciousness: awake and alert Pain management: pain level controlled Vital Signs Assessment: post-procedure vital signs reviewed and stable Respiratory status: spontaneous breathing, nonlabored ventilation, respiratory function stable and patient connected to nasal cannula oxygen Cardiovascular status: blood pressure returned to baseline and stable Postop Assessment: no apparent nausea or vomiting Anesthetic complications: no     Last Vitals:  Vitals:   10/21/18 0955 10/21/18 1005  BP: 112/69 124/71  Pulse: 75 79  Resp: 17 (!) 21  Temp:    SpO2: 97% 97%    Last Pain:  Vitals:   10/22/18 0731  TempSrc:   PainSc: 0-No pain                 Molli Barrows

## 2018-10-23 ENCOUNTER — Ambulatory Visit
Admission: RE | Admit: 2018-10-23 | Discharge: 2018-10-23 | Disposition: A | Discharge status: Home / Self Care | Payer: PPO | Source: Ambulatory Visit | Attending: Family Medicine | Admitting: Family Medicine

## 2018-10-23 ENCOUNTER — Other Ambulatory Visit: Payer: Self-pay

## 2018-10-23 DIAGNOSIS — Z87891 Personal history of nicotine dependence: Secondary | ICD-10-CM | POA: Insufficient documentation

## 2018-10-23 DIAGNOSIS — F17210 Nicotine dependence, cigarettes, uncomplicated: Secondary | ICD-10-CM | POA: Diagnosis not present

## 2018-10-23 DIAGNOSIS — Z0000 Encounter for general adult medical examination without abnormal findings: Secondary | ICD-10-CM | POA: Diagnosis not present

## 2018-10-23 DIAGNOSIS — Z136 Encounter for screening for cardiovascular disorders: Secondary | ICD-10-CM | POA: Diagnosis not present

## 2019-01-21 DIAGNOSIS — L821 Other seborrheic keratosis: Secondary | ICD-10-CM | POA: Diagnosis not present

## 2019-01-21 DIAGNOSIS — D2262 Melanocytic nevi of left upper limb, including shoulder: Secondary | ICD-10-CM | POA: Diagnosis not present

## 2019-01-21 DIAGNOSIS — D2272 Melanocytic nevi of left lower limb, including hip: Secondary | ICD-10-CM | POA: Diagnosis not present

## 2019-01-21 DIAGNOSIS — D2261 Melanocytic nevi of right upper limb, including shoulder: Secondary | ICD-10-CM | POA: Diagnosis not present

## 2019-01-21 DIAGNOSIS — L570 Actinic keratosis: Secondary | ICD-10-CM | POA: Diagnosis not present

## 2019-01-21 DIAGNOSIS — D2271 Melanocytic nevi of right lower limb, including hip: Secondary | ICD-10-CM | POA: Diagnosis not present

## 2019-01-21 DIAGNOSIS — X32XXXA Exposure to sunlight, initial encounter: Secondary | ICD-10-CM | POA: Diagnosis not present

## 2019-01-21 DIAGNOSIS — D225 Melanocytic nevi of trunk: Secondary | ICD-10-CM | POA: Diagnosis not present

## 2019-02-16 DIAGNOSIS — E781 Pure hyperglyceridemia: Secondary | ICD-10-CM | POA: Diagnosis not present

## 2019-02-23 DIAGNOSIS — M159 Polyosteoarthritis, unspecified: Secondary | ICD-10-CM | POA: Diagnosis not present

## 2019-02-23 DIAGNOSIS — E781 Pure hyperglyceridemia: Secondary | ICD-10-CM | POA: Diagnosis not present

## 2019-08-17 DIAGNOSIS — E781 Pure hyperglyceridemia: Secondary | ICD-10-CM | POA: Diagnosis not present

## 2019-08-24 DIAGNOSIS — E781 Pure hyperglyceridemia: Secondary | ICD-10-CM | POA: Diagnosis not present

## 2019-08-24 DIAGNOSIS — E6609 Other obesity due to excess calories: Secondary | ICD-10-CM | POA: Diagnosis not present

## 2019-08-24 DIAGNOSIS — M159 Polyosteoarthritis, unspecified: Secondary | ICD-10-CM | POA: Diagnosis not present

## 2019-08-24 DIAGNOSIS — K644 Residual hemorrhoidal skin tags: Secondary | ICD-10-CM | POA: Diagnosis not present

## 2019-08-24 DIAGNOSIS — Z6830 Body mass index (BMI) 30.0-30.9, adult: Secondary | ICD-10-CM | POA: Diagnosis not present

## 2019-08-24 DIAGNOSIS — Z0000 Encounter for general adult medical examination without abnormal findings: Secondary | ICD-10-CM | POA: Diagnosis not present

## 2019-09-06 DIAGNOSIS — L918 Other hypertrophic disorders of the skin: Secondary | ICD-10-CM | POA: Diagnosis not present

## 2020-01-24 DIAGNOSIS — X32XXXA Exposure to sunlight, initial encounter: Secondary | ICD-10-CM | POA: Diagnosis not present

## 2020-01-24 DIAGNOSIS — D2262 Melanocytic nevi of left upper limb, including shoulder: Secondary | ICD-10-CM | POA: Diagnosis not present

## 2020-01-24 DIAGNOSIS — L821 Other seborrheic keratosis: Secondary | ICD-10-CM | POA: Diagnosis not present

## 2020-01-24 DIAGNOSIS — D225 Melanocytic nevi of trunk: Secondary | ICD-10-CM | POA: Diagnosis not present

## 2020-01-24 DIAGNOSIS — L570 Actinic keratosis: Secondary | ICD-10-CM | POA: Diagnosis not present

## 2020-01-24 DIAGNOSIS — D2272 Melanocytic nevi of left lower limb, including hip: Secondary | ICD-10-CM | POA: Diagnosis not present

## 2020-01-24 DIAGNOSIS — D2271 Melanocytic nevi of right lower limb, including hip: Secondary | ICD-10-CM | POA: Diagnosis not present

## 2020-01-24 DIAGNOSIS — D2261 Melanocytic nevi of right upper limb, including shoulder: Secondary | ICD-10-CM | POA: Diagnosis not present

## 2020-02-18 DIAGNOSIS — E781 Pure hyperglyceridemia: Secondary | ICD-10-CM | POA: Diagnosis not present

## 2020-02-18 DIAGNOSIS — M159 Polyosteoarthritis, unspecified: Secondary | ICD-10-CM | POA: Diagnosis not present

## 2020-02-18 DIAGNOSIS — K644 Residual hemorrhoidal skin tags: Secondary | ICD-10-CM | POA: Diagnosis not present

## 2020-02-25 DIAGNOSIS — Z0000 Encounter for general adult medical examination without abnormal findings: Secondary | ICD-10-CM | POA: Diagnosis not present

## 2020-02-25 DIAGNOSIS — R7309 Other abnormal glucose: Secondary | ICD-10-CM | POA: Diagnosis not present

## 2020-02-25 DIAGNOSIS — E781 Pure hyperglyceridemia: Secondary | ICD-10-CM | POA: Diagnosis not present

## 2020-02-25 DIAGNOSIS — E6609 Other obesity due to excess calories: Secondary | ICD-10-CM | POA: Diagnosis not present

## 2020-02-25 DIAGNOSIS — M159 Polyosteoarthritis, unspecified: Secondary | ICD-10-CM | POA: Diagnosis not present

## 2020-02-25 DIAGNOSIS — Z6830 Body mass index (BMI) 30.0-30.9, adult: Secondary | ICD-10-CM | POA: Diagnosis not present

## 2020-02-25 DIAGNOSIS — K644 Residual hemorrhoidal skin tags: Secondary | ICD-10-CM | POA: Diagnosis not present

## 2020-08-24 DIAGNOSIS — Z0000 Encounter for general adult medical examination without abnormal findings: Secondary | ICD-10-CM | POA: Diagnosis not present

## 2020-08-24 DIAGNOSIS — M159 Polyosteoarthritis, unspecified: Secondary | ICD-10-CM | POA: Diagnosis not present

## 2020-08-24 DIAGNOSIS — E781 Pure hyperglyceridemia: Secondary | ICD-10-CM | POA: Diagnosis not present

## 2020-11-14 IMAGING — US US AORTA SCREENING (MEDICARE)
1 series · 13 of 23 positions shown · non-contrast
Comparison: None.

CLINICAL DATA: Smoker, screening exam

EXAM:
ULTRASOUND OF ABDOMINAL AORTA
TECHNIQUE: Ultrasound examination of the abdominal aorta and proximal common
iliac arteries was performed to evaluate for aneurysm. Additional
color and Doppler images of the distal aorta were obtained to
document patency.

[Series 1: us aorta screening (medicare) · 13 of 23 slices shown]
[im 1/23]
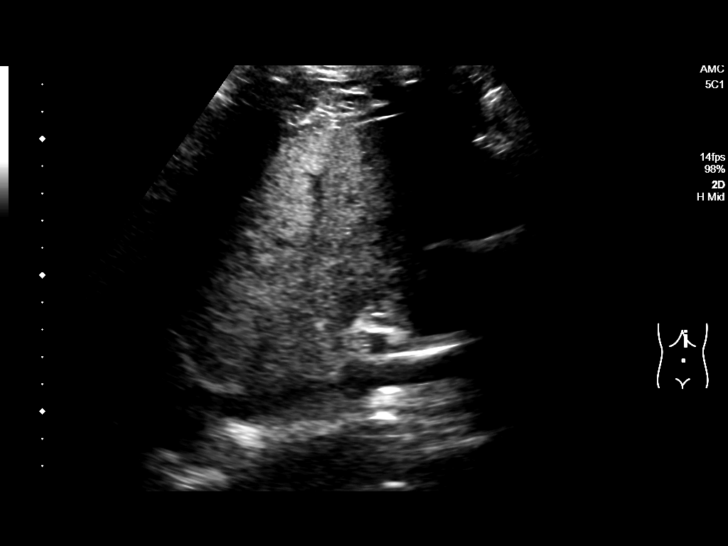
[im 3/23]
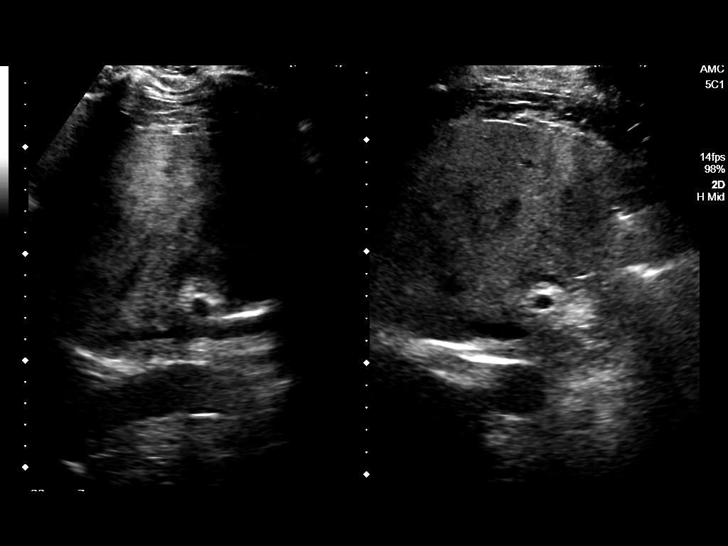
[im 5/23]
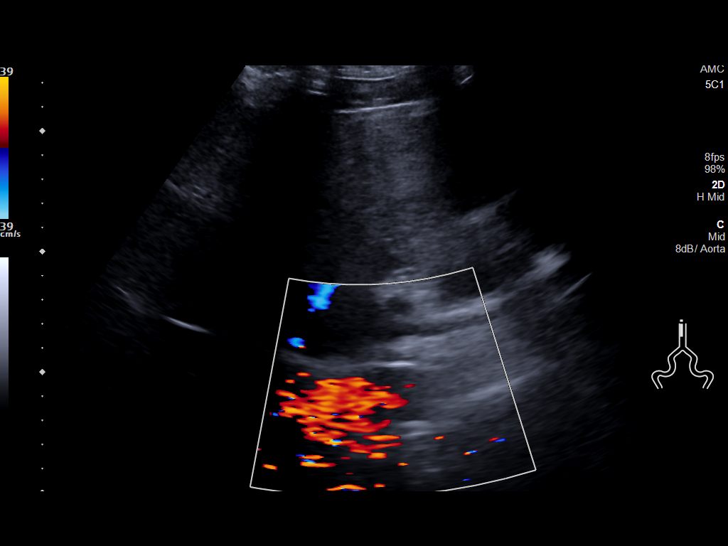
[im 7/23]
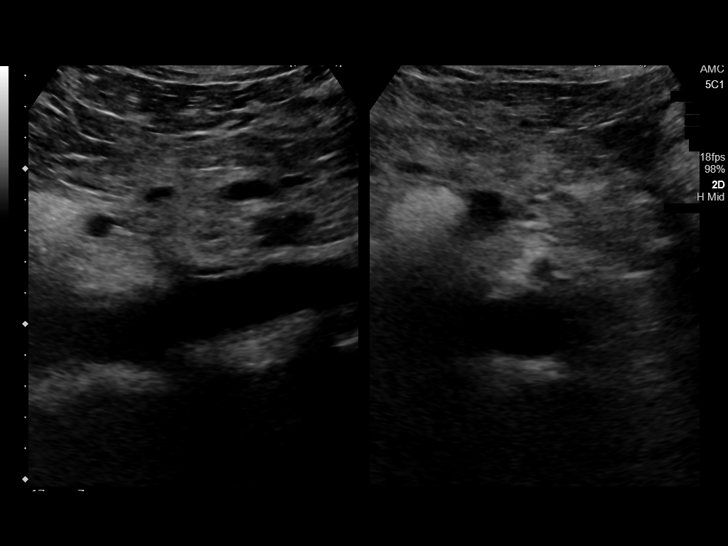
[im 8/23]
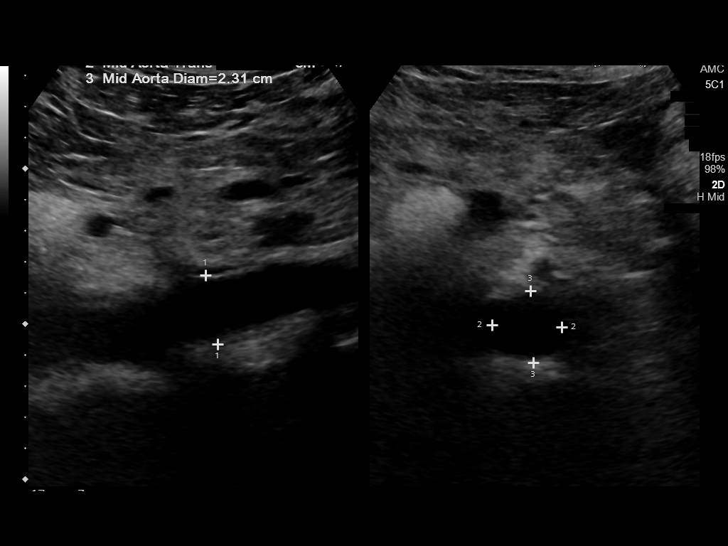
[im 10/23]
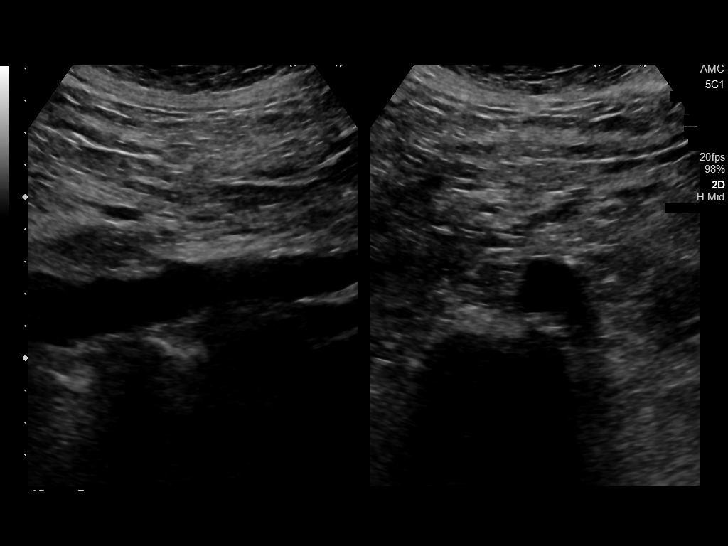
[im 12/23]
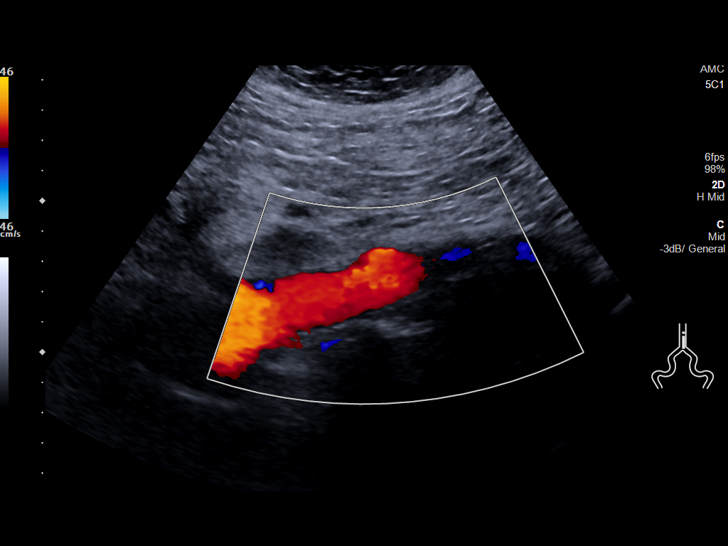
[im 14/23]
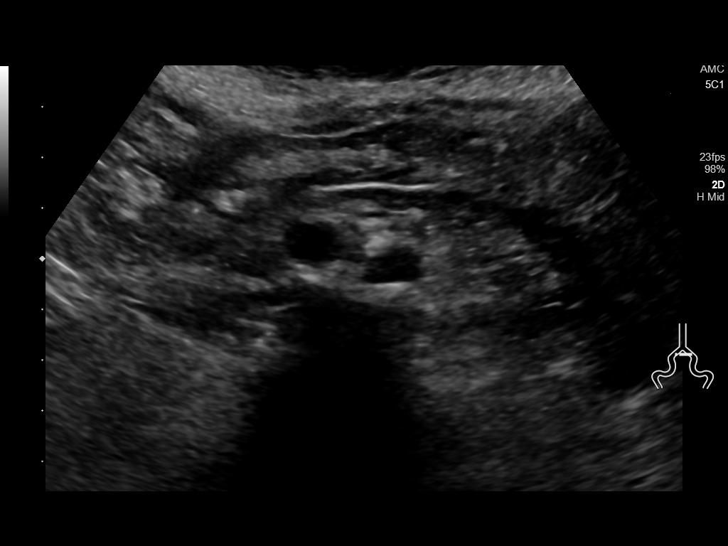
[im 16/23]
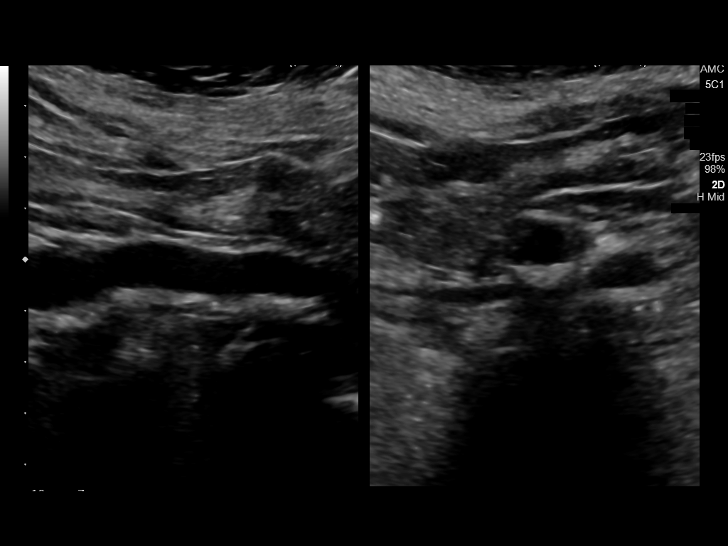
[im 17/23]
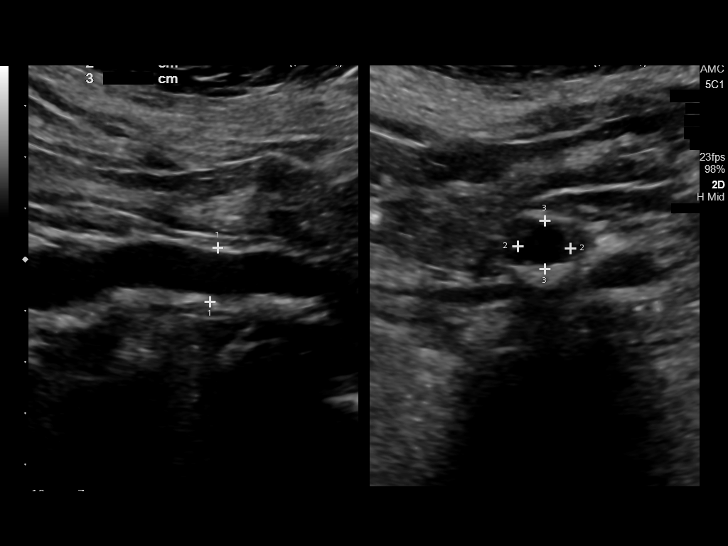
[im 19/23]
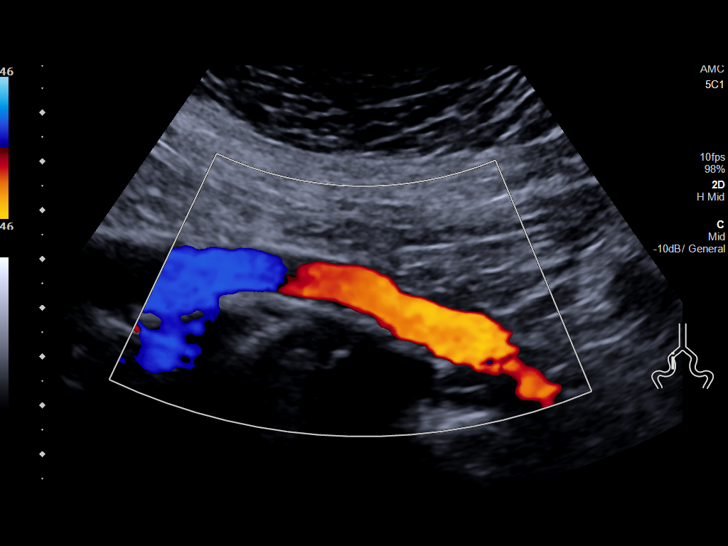
[im 21/23]
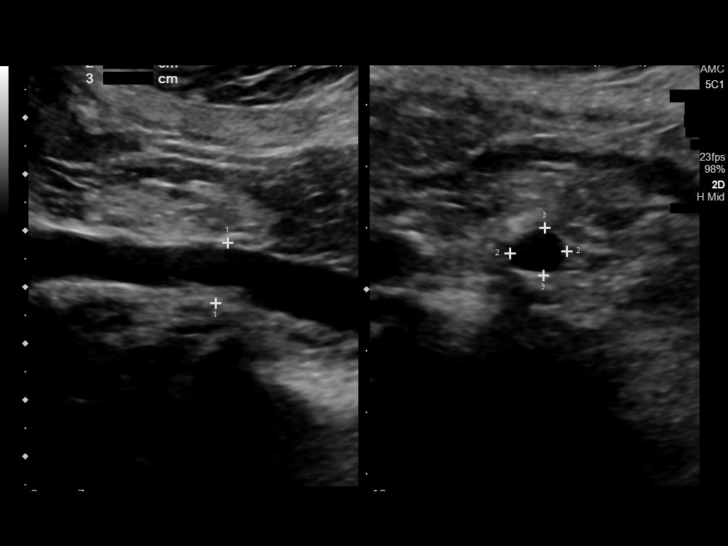
[im 23/23]
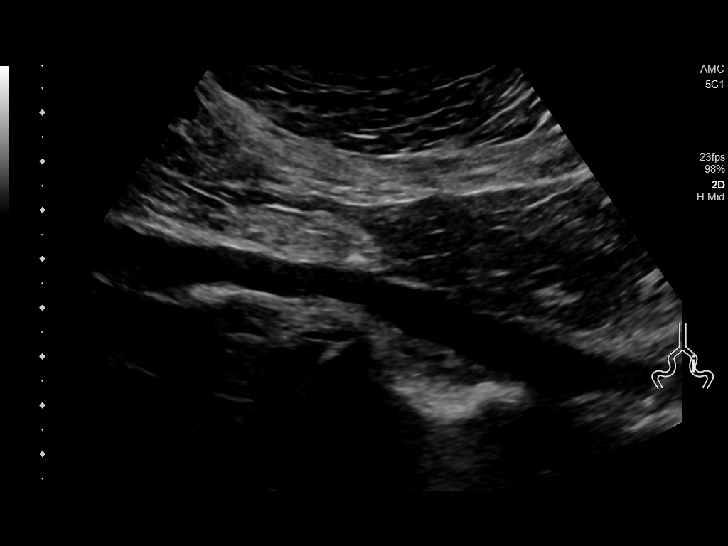

[13 of 23 positions shown; findings below may reference images not displayed]

FINDINGS: Abdominal aortic measurements as follows:

Proximal:  2.6 cm

Mid:  2.3 cm

Distal:  1.9 cm
Patent: Yes, peak systolic velocity is 80 cm/s

Right common iliac artery: 1.1 cm

Left common iliac artery: 1.1 cm

Aortoiliac atherosclerosis noted with wall irregularity and
calcification. No significant aneurysm. Minor proximal aorta
ectasia. Maximal diameter 2.6 cm. No aortoiliac occlusive process.

Heterogeneous increased echogenicity of the liver noted compatible
with hepatic steatosis.
IMPRESSION: Aortoiliac atherosclerosis. Minor proximal aorta ectasia. Maximal
diameter 2.6 cm.

Ectatic abdominal aorta at risk for aneurysm development. Recommend
followup by ultrasound in 5 years. This recommendation follows ACR
consensus guidelines: White Paper of the ACR Incidental Findings
Committee II on Vascular Findings. [HOSPITAL] 8804;

## 2021-01-23 DIAGNOSIS — L570 Actinic keratosis: Secondary | ICD-10-CM | POA: Diagnosis not present

## 2021-01-23 DIAGNOSIS — X32XXXA Exposure to sunlight, initial encounter: Secondary | ICD-10-CM | POA: Diagnosis not present

## 2021-01-23 DIAGNOSIS — I781 Nevus, non-neoplastic: Secondary | ICD-10-CM | POA: Diagnosis not present

## 2021-01-23 DIAGNOSIS — D225 Melanocytic nevi of trunk: Secondary | ICD-10-CM | POA: Diagnosis not present

## 2021-01-23 DIAGNOSIS — D2272 Melanocytic nevi of left lower limb, including hip: Secondary | ICD-10-CM | POA: Diagnosis not present

## 2021-01-23 DIAGNOSIS — D2261 Melanocytic nevi of right upper limb, including shoulder: Secondary | ICD-10-CM | POA: Diagnosis not present

## 2021-01-23 DIAGNOSIS — D2271 Melanocytic nevi of right lower limb, including hip: Secondary | ICD-10-CM | POA: Diagnosis not present

## 2021-01-23 DIAGNOSIS — L821 Other seborrheic keratosis: Secondary | ICD-10-CM | POA: Diagnosis not present

## 2021-01-23 DIAGNOSIS — D2262 Melanocytic nevi of left upper limb, including shoulder: Secondary | ICD-10-CM | POA: Diagnosis not present

## 2021-02-21 DIAGNOSIS — E781 Pure hyperglyceridemia: Secondary | ICD-10-CM | POA: Diagnosis not present

## 2021-02-21 DIAGNOSIS — Z131 Encounter for screening for diabetes mellitus: Secondary | ICD-10-CM | POA: Diagnosis not present

## 2021-02-21 DIAGNOSIS — Z8619 Personal history of other infectious and parasitic diseases: Secondary | ICD-10-CM | POA: Diagnosis not present

## 2021-02-21 DIAGNOSIS — Z136 Encounter for screening for cardiovascular disorders: Secondary | ICD-10-CM | POA: Diagnosis not present

## 2021-02-28 DIAGNOSIS — Z0000 Encounter for general adult medical examination without abnormal findings: Secondary | ICD-10-CM | POA: Diagnosis not present

## 2021-02-28 DIAGNOSIS — E7800 Pure hypercholesterolemia, unspecified: Secondary | ICD-10-CM | POA: Diagnosis not present

## 2021-07-16 DIAGNOSIS — R7402 Elevation of levels of lactic acid dehydrogenase (LDH): Secondary | ICD-10-CM | POA: Diagnosis not present

## 2021-07-16 DIAGNOSIS — F05 Delirium due to known physiological condition: Secondary | ICD-10-CM | POA: Diagnosis not present

## 2021-07-16 DIAGNOSIS — N179 Acute kidney failure, unspecified: Secondary | ICD-10-CM | POA: Diagnosis not present

## 2021-07-16 DIAGNOSIS — R451 Restlessness and agitation: Secondary | ICD-10-CM | POA: Diagnosis not present

## 2021-07-16 DIAGNOSIS — Z9889 Other specified postprocedural states: Secondary | ICD-10-CM | POA: Diagnosis not present

## 2021-07-16 DIAGNOSIS — S271XXA Traumatic hemothorax, initial encounter: Secondary | ICD-10-CM | POA: Diagnosis not present

## 2021-07-16 DIAGNOSIS — K819 Cholecystitis, unspecified: Secondary | ICD-10-CM | POA: Diagnosis not present

## 2021-07-16 DIAGNOSIS — S299XXA Unspecified injury of thorax, initial encounter: Secondary | ICD-10-CM | POA: Diagnosis not present

## 2021-07-16 DIAGNOSIS — Z794 Long term (current) use of insulin: Secondary | ICD-10-CM | POA: Diagnosis not present

## 2021-07-16 DIAGNOSIS — S83231A Complex tear of medial meniscus, current injury, right knee, initial encounter: Secondary | ICD-10-CM | POA: Diagnosis not present

## 2021-07-16 DIAGNOSIS — J811 Chronic pulmonary edema: Secondary | ICD-10-CM | POA: Diagnosis not present

## 2021-07-16 DIAGNOSIS — S36898A Other injury of other intra-abdominal organs, initial encounter: Secondary | ICD-10-CM | POA: Diagnosis not present

## 2021-07-16 DIAGNOSIS — R6510 Systemic inflammatory response syndrome (SIRS) of non-infectious origin without acute organ dysfunction: Secondary | ICD-10-CM | POA: Diagnosis not present

## 2021-07-16 DIAGNOSIS — N2889 Other specified disorders of kidney and ureter: Secondary | ICD-10-CM | POA: Diagnosis not present

## 2021-07-16 DIAGNOSIS — S61411A Laceration without foreign body of right hand, initial encounter: Secondary | ICD-10-CM | POA: Diagnosis not present

## 2021-07-16 DIAGNOSIS — B9689 Other specified bacterial agents as the cause of diseases classified elsewhere: Secondary | ICD-10-CM | POA: Diagnosis not present

## 2021-07-16 DIAGNOSIS — R410 Disorientation, unspecified: Secondary | ICD-10-CM | POA: Diagnosis not present

## 2021-07-16 DIAGNOSIS — J9600 Acute respiratory failure, unspecified whether with hypoxia or hypercapnia: Secondary | ICD-10-CM | POA: Diagnosis not present

## 2021-07-16 DIAGNOSIS — S83281A Other tear of lateral meniscus, current injury, right knee, initial encounter: Secondary | ICD-10-CM | POA: Diagnosis not present

## 2021-07-16 DIAGNOSIS — E872 Acidosis, unspecified: Secondary | ICD-10-CM | POA: Diagnosis not present

## 2021-07-16 DIAGNOSIS — R7989 Other specified abnormal findings of blood chemistry: Secondary | ICD-10-CM | POA: Diagnosis not present

## 2021-07-16 DIAGNOSIS — S2231XA Fracture of one rib, right side, initial encounter for closed fracture: Secondary | ICD-10-CM | POA: Diagnosis not present

## 2021-07-16 DIAGNOSIS — J90 Pleural effusion, not elsewhere classified: Secondary | ICD-10-CM | POA: Diagnosis not present

## 2021-07-16 DIAGNOSIS — S31109A Unspecified open wound of abdominal wall, unspecified quadrant without penetration into peritoneal cavity, initial encounter: Secondary | ICD-10-CM | POA: Diagnosis not present

## 2021-07-16 DIAGNOSIS — K828 Other specified diseases of gallbladder: Secondary | ICD-10-CM | POA: Diagnosis not present

## 2021-07-16 DIAGNOSIS — Z03823 Encounter for observation for suspected inserted (injected) foreign body ruled out: Secondary | ICD-10-CM | POA: Diagnosis not present

## 2021-07-16 DIAGNOSIS — M1711 Unilateral primary osteoarthritis, right knee: Secondary | ICD-10-CM | POA: Diagnosis not present

## 2021-07-16 DIAGNOSIS — S8981XA Other specified injuries of right lower leg, initial encounter: Secondary | ICD-10-CM | POA: Diagnosis not present

## 2021-07-16 DIAGNOSIS — R55 Syncope and collapse: Secondary | ICD-10-CM | POA: Diagnosis not present

## 2021-07-16 DIAGNOSIS — S37092A Other injury of left kidney, initial encounter: Secondary | ICD-10-CM | POA: Diagnosis not present

## 2021-07-16 DIAGNOSIS — Z932 Ileostomy status: Secondary | ICD-10-CM | POA: Diagnosis not present

## 2021-07-16 DIAGNOSIS — T8144XD Sepsis following a procedure, subsequent encounter: Secondary | ICD-10-CM | POA: Diagnosis not present

## 2021-07-16 DIAGNOSIS — J9811 Atelectasis: Secondary | ICD-10-CM | POA: Diagnosis not present

## 2021-07-16 DIAGNOSIS — S069X9A Unspecified intracranial injury with loss of consciousness of unspecified duration, initial encounter: Secondary | ICD-10-CM | POA: Diagnosis not present

## 2021-07-16 DIAGNOSIS — R7401 Elevation of levels of liver transaminase levels: Secondary | ICD-10-CM | POA: Diagnosis not present

## 2021-07-16 DIAGNOSIS — D62 Acute posthemorrhagic anemia: Secondary | ICD-10-CM | POA: Diagnosis not present

## 2021-07-16 DIAGNOSIS — S6992XA Unspecified injury of left wrist, hand and finger(s), initial encounter: Secondary | ICD-10-CM | POA: Diagnosis not present

## 2021-07-16 DIAGNOSIS — S40819A Abrasion of unspecified upper arm, initial encounter: Secondary | ICD-10-CM | POA: Diagnosis not present

## 2021-07-16 DIAGNOSIS — K3189 Other diseases of stomach and duodenum: Secondary | ICD-10-CM | POA: Diagnosis not present

## 2021-07-16 DIAGNOSIS — K822 Perforation of gallbladder: Secondary | ICD-10-CM | POA: Diagnosis not present

## 2021-07-16 DIAGNOSIS — I6523 Occlusion and stenosis of bilateral carotid arteries: Secondary | ICD-10-CM | POA: Diagnosis not present

## 2021-07-16 DIAGNOSIS — Z4682 Encounter for fitting and adjustment of non-vascular catheter: Secondary | ICD-10-CM | POA: Diagnosis not present

## 2021-07-16 DIAGNOSIS — S270XXA Traumatic pneumothorax, initial encounter: Secondary | ICD-10-CM | POA: Diagnosis not present

## 2021-07-16 DIAGNOSIS — R109 Unspecified abdominal pain: Secondary | ICD-10-CM | POA: Diagnosis not present

## 2021-07-16 DIAGNOSIS — K838 Other specified diseases of biliary tract: Secondary | ICD-10-CM | POA: Diagnosis not present

## 2021-07-16 DIAGNOSIS — R748 Abnormal levels of other serum enzymes: Secondary | ICD-10-CM | POA: Diagnosis not present

## 2021-07-16 DIAGNOSIS — S3992XA Unspecified injury of lower back, initial encounter: Secondary | ICD-10-CM | POA: Diagnosis not present

## 2021-07-16 DIAGNOSIS — R6511 Systemic inflammatory response syndrome (SIRS) of non-infectious origin with acute organ dysfunction: Secondary | ICD-10-CM | POA: Diagnosis not present

## 2021-07-16 DIAGNOSIS — S2243XD Multiple fractures of ribs, bilateral, subsequent encounter for fracture with routine healing: Secondary | ICD-10-CM | POA: Diagnosis not present

## 2021-07-16 DIAGNOSIS — R0689 Other abnormalities of breathing: Secondary | ICD-10-CM | POA: Diagnosis not present

## 2021-07-16 DIAGNOSIS — R579 Shock, unspecified: Secondary | ICD-10-CM | POA: Diagnosis not present

## 2021-07-16 DIAGNOSIS — S36528A Contusion of other part of colon, initial encounter: Secondary | ICD-10-CM | POA: Diagnosis not present

## 2021-07-16 DIAGNOSIS — S61412A Laceration without foreign body of left hand, initial encounter: Secondary | ICD-10-CM | POA: Diagnosis not present

## 2021-07-16 DIAGNOSIS — Z20822 Contact with and (suspected) exposure to covid-19: Secondary | ICD-10-CM | POA: Diagnosis not present

## 2021-07-16 DIAGNOSIS — S83521A Sprain of posterior cruciate ligament of right knee, initial encounter: Secondary | ICD-10-CM | POA: Diagnosis not present

## 2021-07-16 DIAGNOSIS — A419 Sepsis, unspecified organism: Secondary | ICD-10-CM | POA: Diagnosis not present

## 2021-07-16 DIAGNOSIS — S8991XA Unspecified injury of right lower leg, initial encounter: Secondary | ICD-10-CM | POA: Diagnosis not present

## 2021-07-16 DIAGNOSIS — Z0189 Encounter for other specified special examinations: Secondary | ICD-10-CM | POA: Diagnosis not present

## 2021-07-16 DIAGNOSIS — S36598A Other injury of other part of colon, initial encounter: Secondary | ICD-10-CM | POA: Diagnosis not present

## 2021-07-16 DIAGNOSIS — R188 Other ascites: Secondary | ICD-10-CM | POA: Diagnosis not present

## 2021-07-16 DIAGNOSIS — J942 Hemothorax: Secondary | ICD-10-CM | POA: Diagnosis not present

## 2021-07-16 DIAGNOSIS — Z1339 Encounter for screening examination for other mental health and behavioral disorders: Secondary | ICD-10-CM | POA: Diagnosis not present

## 2021-07-16 DIAGNOSIS — R7881 Bacteremia: Secondary | ICD-10-CM | POA: Diagnosis not present

## 2021-07-16 DIAGNOSIS — Z431 Encounter for attention to gastrostomy: Secondary | ICD-10-CM | POA: Diagnosis not present

## 2021-07-16 DIAGNOSIS — R0989 Other specified symptoms and signs involving the circulatory and respiratory systems: Secondary | ICD-10-CM | POA: Diagnosis not present

## 2021-07-16 DIAGNOSIS — Z041 Encounter for examination and observation following transport accident: Secondary | ICD-10-CM | POA: Diagnosis not present

## 2021-07-16 DIAGNOSIS — J948 Other specified pleural conditions: Secondary | ICD-10-CM | POA: Diagnosis not present

## 2021-07-16 DIAGNOSIS — R6521 Severe sepsis with septic shock: Secondary | ICD-10-CM | POA: Diagnosis not present

## 2021-07-16 DIAGNOSIS — G8911 Acute pain due to trauma: Secondary | ICD-10-CM | POA: Diagnosis not present

## 2021-07-16 DIAGNOSIS — S271XXD Traumatic hemothorax, subsequent encounter: Secondary | ICD-10-CM | POA: Diagnosis not present

## 2021-07-16 DIAGNOSIS — S60512A Abrasion of left hand, initial encounter: Secondary | ICD-10-CM | POA: Diagnosis not present

## 2021-07-16 DIAGNOSIS — S80211A Abrasion, right knee, initial encounter: Secondary | ICD-10-CM | POA: Diagnosis not present

## 2021-07-16 DIAGNOSIS — S199XXA Unspecified injury of neck, initial encounter: Secondary | ICD-10-CM | POA: Diagnosis not present

## 2021-07-16 DIAGNOSIS — R918 Other nonspecific abnormal finding of lung field: Secondary | ICD-10-CM | POA: Diagnosis not present

## 2021-07-16 DIAGNOSIS — N281 Cyst of kidney, acquired: Secondary | ICD-10-CM | POA: Diagnosis not present

## 2021-07-16 DIAGNOSIS — J939 Pneumothorax, unspecified: Secondary | ICD-10-CM | POA: Diagnosis not present

## 2021-07-16 DIAGNOSIS — S81011A Laceration without foreign body, right knee, initial encounter: Secondary | ICD-10-CM | POA: Diagnosis not present

## 2021-07-16 DIAGNOSIS — Z452 Encounter for adjustment and management of vascular access device: Secondary | ICD-10-CM | POA: Diagnosis not present

## 2021-07-16 DIAGNOSIS — R633 Feeding difficulties, unspecified: Secondary | ICD-10-CM | POA: Diagnosis not present

## 2021-07-16 DIAGNOSIS — S3991XA Unspecified injury of abdomen, initial encounter: Secondary | ICD-10-CM | POA: Diagnosis not present

## 2021-07-16 DIAGNOSIS — F32A Depression, unspecified: Secondary | ICD-10-CM | POA: Diagnosis not present

## 2021-07-16 DIAGNOSIS — K661 Hemoperitoneum: Secondary | ICD-10-CM | POA: Diagnosis not present

## 2021-07-16 DIAGNOSIS — R4182 Altered mental status, unspecified: Secondary | ICD-10-CM | POA: Diagnosis not present

## 2021-07-16 DIAGNOSIS — R000 Tachycardia, unspecified: Secondary | ICD-10-CM | POA: Diagnosis not present

## 2021-07-16 DIAGNOSIS — I959 Hypotension, unspecified: Secondary | ICD-10-CM | POA: Diagnosis not present

## 2021-07-16 DIAGNOSIS — R937 Abnormal findings on diagnostic imaging of other parts of musculoskeletal system: Secondary | ICD-10-CM | POA: Diagnosis not present

## 2021-07-16 DIAGNOSIS — A4151 Sepsis due to Escherichia coli [E. coli]: Secondary | ICD-10-CM | POA: Diagnosis not present

## 2021-07-16 DIAGNOSIS — R6339 Other feeding difficulties: Secondary | ICD-10-CM | POA: Diagnosis not present

## 2021-07-16 DIAGNOSIS — S2243XA Multiple fractures of ribs, bilateral, initial encounter for closed fracture: Secondary | ICD-10-CM | POA: Diagnosis not present

## 2021-07-16 DIAGNOSIS — R739 Hyperglycemia, unspecified: Secondary | ICD-10-CM | POA: Diagnosis not present

## 2021-07-16 DIAGNOSIS — A4181 Sepsis due to Enterococcus: Secondary | ICD-10-CM | POA: Diagnosis not present

## 2021-07-16 DIAGNOSIS — K82A2 Perforation of gallbladder in cholecystitis: Secondary | ICD-10-CM | POA: Diagnosis not present

## 2021-07-16 DIAGNOSIS — J9601 Acute respiratory failure with hypoxia: Secondary | ICD-10-CM | POA: Diagnosis not present

## 2021-07-16 DIAGNOSIS — S022XXA Fracture of nasal bones, initial encounter for closed fracture: Secondary | ICD-10-CM | POA: Diagnosis not present

## 2021-07-16 DIAGNOSIS — Z9911 Dependence on respirator [ventilator] status: Secondary | ICD-10-CM | POA: Diagnosis not present

## 2021-07-16 DIAGNOSIS — S02401A Maxillary fracture, unspecified, initial encounter for closed fracture: Secondary | ICD-10-CM | POA: Diagnosis not present

## 2021-07-16 DIAGNOSIS — S6991XA Unspecified injury of right wrist, hand and finger(s), initial encounter: Secondary | ICD-10-CM | POA: Diagnosis not present

## 2021-07-16 DIAGNOSIS — S3993XA Unspecified injury of pelvis, initial encounter: Secondary | ICD-10-CM | POA: Diagnosis not present

## 2021-07-16 DIAGNOSIS — S0990XA Unspecified injury of head, initial encounter: Secondary | ICD-10-CM | POA: Diagnosis not present

## 2021-07-16 DIAGNOSIS — G479 Sleep disorder, unspecified: Secondary | ICD-10-CM | POA: Diagnosis not present

## 2021-07-16 DIAGNOSIS — S0240CA Maxillary fracture, right side, initial encounter for closed fracture: Secondary | ICD-10-CM | POA: Diagnosis not present

## 2021-07-16 DIAGNOSIS — S36112A Contusion of liver, initial encounter: Secondary | ICD-10-CM | POA: Diagnosis not present

## 2021-07-16 DIAGNOSIS — D689 Coagulation defect, unspecified: Secondary | ICD-10-CM | POA: Diagnosis not present

## 2021-07-16 DIAGNOSIS — S2241XA Multiple fractures of ribs, right side, initial encounter for closed fracture: Secondary | ICD-10-CM | POA: Diagnosis not present

## 2021-07-16 DIAGNOSIS — S27322A Contusion of lung, bilateral, initial encounter: Secondary | ICD-10-CM | POA: Diagnosis not present

## 2021-07-16 DIAGNOSIS — R509 Fever, unspecified: Secondary | ICD-10-CM | POA: Diagnosis not present

## 2021-07-16 DIAGNOSIS — S31609A Unspecified open wound of abdominal wall, unspecified quadrant with penetration into peritoneal cavity, initial encounter: Secondary | ICD-10-CM | POA: Diagnosis not present

## 2021-07-16 DIAGNOSIS — E1165 Type 2 diabetes mellitus with hyperglycemia: Secondary | ICD-10-CM | POA: Diagnosis not present

## 2021-07-16 DIAGNOSIS — K651 Peritoneal abscess: Secondary | ICD-10-CM | POA: Diagnosis not present

## 2021-07-16 DIAGNOSIS — K750 Abscess of liver: Secondary | ICD-10-CM | POA: Diagnosis not present

## 2021-07-16 DIAGNOSIS — M25319 Other instability, unspecified shoulder: Secondary | ICD-10-CM | POA: Diagnosis not present

## 2021-07-16 DIAGNOSIS — R932 Abnormal findings on diagnostic imaging of liver and biliary tract: Secondary | ICD-10-CM | POA: Diagnosis not present

## 2021-07-16 DIAGNOSIS — S272XXA Traumatic hemopneumothorax, initial encounter: Secondary | ICD-10-CM | POA: Diagnosis not present

## 2021-07-16 DIAGNOSIS — K810 Acute cholecystitis: Secondary | ICD-10-CM | POA: Diagnosis not present

## 2021-07-16 DIAGNOSIS — S83289A Other tear of lateral meniscus, current injury, unspecified knee, initial encounter: Secondary | ICD-10-CM | POA: Diagnosis not present

## 2021-07-16 DIAGNOSIS — R571 Hypovolemic shock: Secondary | ICD-10-CM | POA: Diagnosis not present

## 2021-07-16 DIAGNOSIS — K9189 Other postprocedural complications and disorders of digestive system: Secondary | ICD-10-CM | POA: Diagnosis not present

## 2021-07-16 DIAGNOSIS — G4720 Circadian rhythm sleep disorder, unspecified type: Secondary | ICD-10-CM | POA: Diagnosis not present

## 2021-07-16 DIAGNOSIS — Z9049 Acquired absence of other specified parts of digestive tract: Secondary | ICD-10-CM | POA: Diagnosis not present

## 2021-07-16 DIAGNOSIS — Z978 Presence of other specified devices: Secondary | ICD-10-CM | POA: Diagnosis not present

## 2021-07-16 DIAGNOSIS — R0603 Acute respiratory distress: Secondary | ICD-10-CM | POA: Diagnosis not present

## 2021-07-16 DIAGNOSIS — R062 Wheezing: Secondary | ICD-10-CM | POA: Diagnosis not present

## 2021-07-17 DIAGNOSIS — S31609A Unspecified open wound of abdominal wall, unspecified quadrant with penetration into peritoneal cavity, initial encounter: Secondary | ICD-10-CM | POA: Diagnosis not present

## 2021-07-17 DIAGNOSIS — S80211A Abrasion, right knee, initial encounter: Secondary | ICD-10-CM | POA: Diagnosis not present

## 2021-07-17 DIAGNOSIS — M25319 Other instability, unspecified shoulder: Secondary | ICD-10-CM | POA: Diagnosis not present

## 2021-07-17 DIAGNOSIS — M1711 Unilateral primary osteoarthritis, right knee: Secondary | ICD-10-CM | POA: Diagnosis not present

## 2021-07-17 DIAGNOSIS — Z041 Encounter for examination and observation following transport accident: Secondary | ICD-10-CM | POA: Diagnosis not present

## 2021-07-17 DIAGNOSIS — Z4682 Encounter for fitting and adjustment of non-vascular catheter: Secondary | ICD-10-CM | POA: Diagnosis not present

## 2021-07-17 DIAGNOSIS — R6510 Systemic inflammatory response syndrome (SIRS) of non-infectious origin without acute organ dysfunction: Secondary | ICD-10-CM | POA: Diagnosis not present

## 2021-07-17 DIAGNOSIS — N179 Acute kidney failure, unspecified: Secondary | ICD-10-CM | POA: Diagnosis not present

## 2021-07-17 DIAGNOSIS — Z452 Encounter for adjustment and management of vascular access device: Secondary | ICD-10-CM | POA: Diagnosis not present

## 2021-07-17 DIAGNOSIS — J939 Pneumothorax, unspecified: Secondary | ICD-10-CM | POA: Diagnosis not present

## 2021-07-17 DIAGNOSIS — S60512A Abrasion of left hand, initial encounter: Secondary | ICD-10-CM | POA: Diagnosis not present

## 2021-07-17 DIAGNOSIS — R579 Shock, unspecified: Secondary | ICD-10-CM | POA: Diagnosis not present

## 2021-07-17 DIAGNOSIS — R918 Other nonspecific abnormal finding of lung field: Secondary | ICD-10-CM | POA: Diagnosis not present

## 2021-07-17 DIAGNOSIS — R0689 Other abnormalities of breathing: Secondary | ICD-10-CM | POA: Diagnosis not present

## 2021-07-17 DIAGNOSIS — S6992XA Unspecified injury of left wrist, hand and finger(s), initial encounter: Secondary | ICD-10-CM | POA: Diagnosis not present

## 2021-07-17 DIAGNOSIS — R000 Tachycardia, unspecified: Secondary | ICD-10-CM | POA: Diagnosis not present

## 2021-07-17 DIAGNOSIS — J90 Pleural effusion, not elsewhere classified: Secondary | ICD-10-CM | POA: Diagnosis not present

## 2021-07-17 DIAGNOSIS — R937 Abnormal findings on diagnostic imaging of other parts of musculoskeletal system: Secondary | ICD-10-CM | POA: Diagnosis not present

## 2021-07-17 DIAGNOSIS — S6991XA Unspecified injury of right wrist, hand and finger(s), initial encounter: Secondary | ICD-10-CM | POA: Diagnosis not present

## 2021-07-17 DIAGNOSIS — S2241XA Multiple fractures of ribs, right side, initial encounter for closed fracture: Secondary | ICD-10-CM | POA: Diagnosis not present

## 2021-07-17 DIAGNOSIS — S31109A Unspecified open wound of abdominal wall, unspecified quadrant without penetration into peritoneal cavity, initial encounter: Secondary | ICD-10-CM | POA: Diagnosis not present

## 2021-07-18 DIAGNOSIS — J942 Hemothorax: Secondary | ICD-10-CM | POA: Diagnosis not present

## 2021-07-18 DIAGNOSIS — N179 Acute kidney failure, unspecified: Secondary | ICD-10-CM | POA: Diagnosis not present

## 2021-07-18 DIAGNOSIS — R918 Other nonspecific abnormal finding of lung field: Secondary | ICD-10-CM | POA: Diagnosis not present

## 2021-07-18 DIAGNOSIS — D62 Acute posthemorrhagic anemia: Secondary | ICD-10-CM | POA: Diagnosis not present

## 2021-07-18 DIAGNOSIS — R6511 Systemic inflammatory response syndrome (SIRS) of non-infectious origin with acute organ dysfunction: Secondary | ICD-10-CM | POA: Diagnosis not present

## 2021-07-18 DIAGNOSIS — J939 Pneumothorax, unspecified: Secondary | ICD-10-CM | POA: Diagnosis not present

## 2021-07-18 DIAGNOSIS — S3993XA Unspecified injury of pelvis, initial encounter: Secondary | ICD-10-CM | POA: Diagnosis not present

## 2021-07-18 DIAGNOSIS — R0689 Other abnormalities of breathing: Secondary | ICD-10-CM | POA: Diagnosis not present

## 2021-07-18 DIAGNOSIS — J90 Pleural effusion, not elsewhere classified: Secondary | ICD-10-CM | POA: Diagnosis not present

## 2021-07-18 DIAGNOSIS — S3991XA Unspecified injury of abdomen, initial encounter: Secondary | ICD-10-CM | POA: Diagnosis not present

## 2021-07-18 DIAGNOSIS — N2889 Other specified disorders of kidney and ureter: Secondary | ICD-10-CM | POA: Diagnosis not present

## 2021-07-18 DIAGNOSIS — S272XXA Traumatic hemopneumothorax, initial encounter: Secondary | ICD-10-CM | POA: Diagnosis not present

## 2021-07-18 DIAGNOSIS — J9811 Atelectasis: Secondary | ICD-10-CM | POA: Diagnosis not present

## 2021-07-18 DIAGNOSIS — Z9889 Other specified postprocedural states: Secondary | ICD-10-CM | POA: Diagnosis not present

## 2021-07-18 DIAGNOSIS — S2243XA Multiple fractures of ribs, bilateral, initial encounter for closed fracture: Secondary | ICD-10-CM | POA: Diagnosis not present

## 2021-07-19 DIAGNOSIS — J9811 Atelectasis: Secondary | ICD-10-CM | POA: Diagnosis not present

## 2021-07-19 DIAGNOSIS — J90 Pleural effusion, not elsewhere classified: Secondary | ICD-10-CM | POA: Diagnosis not present

## 2021-07-19 DIAGNOSIS — R000 Tachycardia, unspecified: Secondary | ICD-10-CM | POA: Diagnosis not present

## 2021-07-19 DIAGNOSIS — K661 Hemoperitoneum: Secondary | ICD-10-CM | POA: Diagnosis not present

## 2021-07-19 DIAGNOSIS — S40819A Abrasion of unspecified upper arm, initial encounter: Secondary | ICD-10-CM | POA: Diagnosis not present

## 2021-07-19 DIAGNOSIS — S0990XA Unspecified injury of head, initial encounter: Secondary | ICD-10-CM | POA: Diagnosis not present

## 2021-07-19 DIAGNOSIS — Z9889 Other specified postprocedural states: Secondary | ICD-10-CM | POA: Diagnosis not present

## 2021-07-19 DIAGNOSIS — J811 Chronic pulmonary edema: Secondary | ICD-10-CM | POA: Diagnosis not present

## 2021-07-19 DIAGNOSIS — R0689 Other abnormalities of breathing: Secondary | ICD-10-CM | POA: Diagnosis not present

## 2021-07-19 DIAGNOSIS — Z4682 Encounter for fitting and adjustment of non-vascular catheter: Secondary | ICD-10-CM | POA: Diagnosis not present

## 2021-07-19 DIAGNOSIS — R571 Hypovolemic shock: Secondary | ICD-10-CM | POA: Diagnosis not present

## 2021-07-19 DIAGNOSIS — S271XXA Traumatic hemothorax, initial encounter: Secondary | ICD-10-CM | POA: Diagnosis not present

## 2021-07-20 DIAGNOSIS — R571 Hypovolemic shock: Secondary | ICD-10-CM | POA: Diagnosis not present

## 2021-07-20 DIAGNOSIS — S270XXA Traumatic pneumothorax, initial encounter: Secondary | ICD-10-CM | POA: Diagnosis not present

## 2021-07-20 DIAGNOSIS — R6510 Systemic inflammatory response syndrome (SIRS) of non-infectious origin without acute organ dysfunction: Secondary | ICD-10-CM | POA: Diagnosis not present

## 2021-07-20 DIAGNOSIS — R0689 Other abnormalities of breathing: Secondary | ICD-10-CM | POA: Diagnosis not present

## 2021-07-20 DIAGNOSIS — R000 Tachycardia, unspecified: Secondary | ICD-10-CM | POA: Diagnosis not present

## 2021-07-21 DIAGNOSIS — S02401A Maxillary fracture, unspecified, initial encounter for closed fracture: Secondary | ICD-10-CM | POA: Diagnosis not present

## 2021-07-21 DIAGNOSIS — S2243XA Multiple fractures of ribs, bilateral, initial encounter for closed fracture: Secondary | ICD-10-CM | POA: Diagnosis not present

## 2021-07-21 DIAGNOSIS — R739 Hyperglycemia, unspecified: Secondary | ICD-10-CM | POA: Diagnosis not present

## 2021-07-22 DIAGNOSIS — R739 Hyperglycemia, unspecified: Secondary | ICD-10-CM | POA: Diagnosis not present

## 2021-07-22 DIAGNOSIS — S02401A Maxillary fracture, unspecified, initial encounter for closed fracture: Secondary | ICD-10-CM | POA: Diagnosis not present

## 2021-07-22 DIAGNOSIS — S2243XA Multiple fractures of ribs, bilateral, initial encounter for closed fracture: Secondary | ICD-10-CM | POA: Diagnosis not present

## 2021-07-22 DIAGNOSIS — G8911 Acute pain due to trauma: Secondary | ICD-10-CM | POA: Diagnosis not present

## 2021-07-23 DIAGNOSIS — S2243XA Multiple fractures of ribs, bilateral, initial encounter for closed fracture: Secondary | ICD-10-CM | POA: Diagnosis not present

## 2021-07-23 DIAGNOSIS — M1711 Unilateral primary osteoarthritis, right knee: Secondary | ICD-10-CM | POA: Diagnosis not present

## 2021-07-23 DIAGNOSIS — S37092A Other injury of left kidney, initial encounter: Secondary | ICD-10-CM | POA: Diagnosis not present

## 2021-07-23 DIAGNOSIS — S83281A Other tear of lateral meniscus, current injury, right knee, initial encounter: Secondary | ICD-10-CM | POA: Diagnosis not present

## 2021-07-23 DIAGNOSIS — R739 Hyperglycemia, unspecified: Secondary | ICD-10-CM | POA: Diagnosis not present

## 2021-07-23 DIAGNOSIS — S8981XA Other specified injuries of right lower leg, initial encounter: Secondary | ICD-10-CM | POA: Diagnosis not present

## 2021-07-23 DIAGNOSIS — S83231A Complex tear of medial meniscus, current injury, right knee, initial encounter: Secondary | ICD-10-CM | POA: Diagnosis not present

## 2021-07-23 DIAGNOSIS — S83521A Sprain of posterior cruciate ligament of right knee, initial encounter: Secondary | ICD-10-CM | POA: Diagnosis not present

## 2021-07-23 DIAGNOSIS — S8991XA Unspecified injury of right lower leg, initial encounter: Secondary | ICD-10-CM | POA: Diagnosis not present

## 2021-07-24 DIAGNOSIS — S2243XA Multiple fractures of ribs, bilateral, initial encounter for closed fracture: Secondary | ICD-10-CM | POA: Diagnosis not present

## 2021-07-24 DIAGNOSIS — S81011A Laceration without foreign body, right knee, initial encounter: Secondary | ICD-10-CM | POA: Diagnosis not present

## 2021-07-24 DIAGNOSIS — S83281A Other tear of lateral meniscus, current injury, right knee, initial encounter: Secondary | ICD-10-CM | POA: Diagnosis not present

## 2021-07-24 DIAGNOSIS — S83231A Complex tear of medial meniscus, current injury, right knee, initial encounter: Secondary | ICD-10-CM | POA: Diagnosis not present

## 2021-07-24 DIAGNOSIS — S83521A Sprain of posterior cruciate ligament of right knee, initial encounter: Secondary | ICD-10-CM | POA: Diagnosis not present

## 2021-07-24 DIAGNOSIS — S02401A Maxillary fracture, unspecified, initial encounter for closed fracture: Secondary | ICD-10-CM | POA: Diagnosis not present

## 2021-07-24 DIAGNOSIS — R739 Hyperglycemia, unspecified: Secondary | ICD-10-CM | POA: Diagnosis not present

## 2021-07-25 DIAGNOSIS — S27322A Contusion of lung, bilateral, initial encounter: Secondary | ICD-10-CM | POA: Diagnosis not present

## 2021-07-25 DIAGNOSIS — S02401A Maxillary fracture, unspecified, initial encounter for closed fracture: Secondary | ICD-10-CM | POA: Diagnosis not present

## 2021-07-25 DIAGNOSIS — R739 Hyperglycemia, unspecified: Secondary | ICD-10-CM | POA: Diagnosis not present

## 2021-07-25 DIAGNOSIS — S2243XA Multiple fractures of ribs, bilateral, initial encounter for closed fracture: Secondary | ICD-10-CM | POA: Diagnosis not present

## 2021-07-26 DIAGNOSIS — A419 Sepsis, unspecified organism: Secondary | ICD-10-CM | POA: Diagnosis not present

## 2021-07-26 DIAGNOSIS — R932 Abnormal findings on diagnostic imaging of liver and biliary tract: Secondary | ICD-10-CM | POA: Diagnosis not present

## 2021-07-26 DIAGNOSIS — R062 Wheezing: Secondary | ICD-10-CM | POA: Diagnosis not present

## 2021-07-26 DIAGNOSIS — R188 Other ascites: Secondary | ICD-10-CM | POA: Diagnosis not present

## 2021-07-26 DIAGNOSIS — Z452 Encounter for adjustment and management of vascular access device: Secondary | ICD-10-CM | POA: Diagnosis not present

## 2021-07-26 DIAGNOSIS — R0603 Acute respiratory distress: Secondary | ICD-10-CM | POA: Diagnosis not present

## 2021-07-26 DIAGNOSIS — J9811 Atelectasis: Secondary | ICD-10-CM | POA: Diagnosis not present

## 2021-07-26 DIAGNOSIS — K828 Other specified diseases of gallbladder: Secondary | ICD-10-CM | POA: Diagnosis not present

## 2021-07-26 DIAGNOSIS — R579 Shock, unspecified: Secondary | ICD-10-CM | POA: Diagnosis not present

## 2021-07-26 DIAGNOSIS — Z4682 Encounter for fitting and adjustment of non-vascular catheter: Secondary | ICD-10-CM | POA: Diagnosis not present

## 2021-07-26 DIAGNOSIS — S2243XA Multiple fractures of ribs, bilateral, initial encounter for closed fracture: Secondary | ICD-10-CM | POA: Diagnosis not present

## 2021-07-26 DIAGNOSIS — I959 Hypotension, unspecified: Secondary | ICD-10-CM | POA: Diagnosis not present

## 2021-07-26 DIAGNOSIS — R7989 Other specified abnormal findings of blood chemistry: Secondary | ICD-10-CM | POA: Diagnosis not present

## 2021-07-26 DIAGNOSIS — R7402 Elevation of levels of lactic acid dehydrogenase (LDH): Secondary | ICD-10-CM | POA: Diagnosis not present

## 2021-07-26 DIAGNOSIS — J90 Pleural effusion, not elsewhere classified: Secondary | ICD-10-CM | POA: Diagnosis not present

## 2021-07-26 DIAGNOSIS — J811 Chronic pulmonary edema: Secondary | ICD-10-CM | POA: Diagnosis not present

## 2021-07-26 DIAGNOSIS — R6521 Severe sepsis with septic shock: Secondary | ICD-10-CM | POA: Diagnosis not present

## 2021-07-26 DIAGNOSIS — Z9911 Dependence on respirator [ventilator] status: Secondary | ICD-10-CM | POA: Diagnosis not present

## 2021-07-26 DIAGNOSIS — R918 Other nonspecific abnormal finding of lung field: Secondary | ICD-10-CM | POA: Diagnosis not present

## 2021-07-26 DIAGNOSIS — R109 Unspecified abdominal pain: Secondary | ICD-10-CM | POA: Diagnosis not present

## 2021-07-27 DIAGNOSIS — J90 Pleural effusion, not elsewhere classified: Secondary | ICD-10-CM | POA: Diagnosis not present

## 2021-07-27 DIAGNOSIS — J811 Chronic pulmonary edema: Secondary | ICD-10-CM | POA: Diagnosis not present

## 2021-07-27 DIAGNOSIS — J9600 Acute respiratory failure, unspecified whether with hypoxia or hypercapnia: Secondary | ICD-10-CM | POA: Diagnosis not present

## 2021-07-27 DIAGNOSIS — J9811 Atelectasis: Secondary | ICD-10-CM | POA: Diagnosis not present

## 2021-07-27 DIAGNOSIS — R7989 Other specified abnormal findings of blood chemistry: Secondary | ICD-10-CM | POA: Diagnosis not present

## 2021-07-27 DIAGNOSIS — I959 Hypotension, unspecified: Secondary | ICD-10-CM | POA: Diagnosis not present

## 2021-07-27 DIAGNOSIS — R4182 Altered mental status, unspecified: Secondary | ICD-10-CM | POA: Diagnosis not present

## 2021-07-27 DIAGNOSIS — Z452 Encounter for adjustment and management of vascular access device: Secondary | ICD-10-CM | POA: Diagnosis not present

## 2021-07-27 DIAGNOSIS — R6521 Severe sepsis with septic shock: Secondary | ICD-10-CM | POA: Diagnosis not present

## 2021-07-27 DIAGNOSIS — Z0189 Encounter for other specified special examinations: Secondary | ICD-10-CM | POA: Diagnosis not present

## 2021-07-27 DIAGNOSIS — K9189 Other postprocedural complications and disorders of digestive system: Secondary | ICD-10-CM | POA: Diagnosis not present

## 2021-07-27 DIAGNOSIS — K828 Other specified diseases of gallbladder: Secondary | ICD-10-CM | POA: Diagnosis not present

## 2021-07-27 DIAGNOSIS — A419 Sepsis, unspecified organism: Secondary | ICD-10-CM | POA: Diagnosis not present

## 2021-07-28 DIAGNOSIS — R4182 Altered mental status, unspecified: Secondary | ICD-10-CM | POA: Diagnosis not present

## 2021-07-28 DIAGNOSIS — Z9911 Dependence on respirator [ventilator] status: Secondary | ICD-10-CM | POA: Diagnosis not present

## 2021-07-28 DIAGNOSIS — A419 Sepsis, unspecified organism: Secondary | ICD-10-CM | POA: Diagnosis not present

## 2021-07-28 DIAGNOSIS — R6521 Severe sepsis with septic shock: Secondary | ICD-10-CM | POA: Diagnosis not present

## 2021-07-28 DIAGNOSIS — J9600 Acute respiratory failure, unspecified whether with hypoxia or hypercapnia: Secondary | ICD-10-CM | POA: Diagnosis not present

## 2021-07-29 DIAGNOSIS — R0989 Other specified symptoms and signs involving the circulatory and respiratory systems: Secondary | ICD-10-CM | POA: Diagnosis not present

## 2021-07-29 DIAGNOSIS — J9600 Acute respiratory failure, unspecified whether with hypoxia or hypercapnia: Secondary | ICD-10-CM | POA: Diagnosis not present

## 2021-07-29 DIAGNOSIS — R7402 Elevation of levels of lactic acid dehydrogenase (LDH): Secondary | ICD-10-CM | POA: Diagnosis not present

## 2021-07-29 DIAGNOSIS — S31109A Unspecified open wound of abdominal wall, unspecified quadrant without penetration into peritoneal cavity, initial encounter: Secondary | ICD-10-CM | POA: Diagnosis not present

## 2021-07-29 DIAGNOSIS — S31609A Unspecified open wound of abdominal wall, unspecified quadrant with penetration into peritoneal cavity, initial encounter: Secondary | ICD-10-CM | POA: Diagnosis not present

## 2021-07-29 DIAGNOSIS — Z9911 Dependence on respirator [ventilator] status: Secondary | ICD-10-CM | POA: Diagnosis not present

## 2021-07-29 DIAGNOSIS — R7401 Elevation of levels of liver transaminase levels: Secondary | ICD-10-CM | POA: Diagnosis not present

## 2021-07-29 DIAGNOSIS — S0990XA Unspecified injury of head, initial encounter: Secondary | ICD-10-CM | POA: Diagnosis not present

## 2021-07-29 DIAGNOSIS — R4182 Altered mental status, unspecified: Secondary | ICD-10-CM | POA: Diagnosis not present

## 2021-07-29 DIAGNOSIS — K661 Hemoperitoneum: Secondary | ICD-10-CM | POA: Diagnosis not present

## 2021-07-29 DIAGNOSIS — A419 Sepsis, unspecified organism: Secondary | ICD-10-CM | POA: Diagnosis not present

## 2021-07-29 DIAGNOSIS — J9811 Atelectasis: Secondary | ICD-10-CM | POA: Diagnosis not present

## 2021-07-29 DIAGNOSIS — Z03823 Encounter for observation for suspected inserted (injected) foreign body ruled out: Secondary | ICD-10-CM | POA: Diagnosis not present

## 2021-07-29 DIAGNOSIS — J90 Pleural effusion, not elsewhere classified: Secondary | ICD-10-CM | POA: Diagnosis not present

## 2021-07-29 DIAGNOSIS — S40819A Abrasion of unspecified upper arm, initial encounter: Secondary | ICD-10-CM | POA: Diagnosis not present

## 2021-07-29 DIAGNOSIS — R6521 Severe sepsis with septic shock: Secondary | ICD-10-CM | POA: Diagnosis not present

## 2021-07-30 DIAGNOSIS — R0689 Other abnormalities of breathing: Secondary | ICD-10-CM | POA: Diagnosis not present

## 2021-07-30 DIAGNOSIS — R000 Tachycardia, unspecified: Secondary | ICD-10-CM | POA: Diagnosis not present

## 2021-07-30 DIAGNOSIS — Z9911 Dependence on respirator [ventilator] status: Secondary | ICD-10-CM | POA: Diagnosis not present

## 2021-07-30 DIAGNOSIS — R571 Hypovolemic shock: Secondary | ICD-10-CM | POA: Diagnosis not present

## 2021-07-30 DIAGNOSIS — J90 Pleural effusion, not elsewhere classified: Secondary | ICD-10-CM | POA: Diagnosis not present

## 2021-07-30 DIAGNOSIS — Z4682 Encounter for fitting and adjustment of non-vascular catheter: Secondary | ICD-10-CM | POA: Diagnosis not present

## 2021-07-30 DIAGNOSIS — R6510 Systemic inflammatory response syndrome (SIRS) of non-infectious origin without acute organ dysfunction: Secondary | ICD-10-CM | POA: Diagnosis not present

## 2021-07-31 DIAGNOSIS — Z9911 Dependence on respirator [ventilator] status: Secondary | ICD-10-CM | POA: Diagnosis not present

## 2021-07-31 DIAGNOSIS — R0689 Other abnormalities of breathing: Secondary | ICD-10-CM | POA: Diagnosis not present

## 2021-07-31 DIAGNOSIS — R6510 Systemic inflammatory response syndrome (SIRS) of non-infectious origin without acute organ dysfunction: Secondary | ICD-10-CM | POA: Diagnosis not present

## 2021-07-31 DIAGNOSIS — R000 Tachycardia, unspecified: Secondary | ICD-10-CM | POA: Diagnosis not present

## 2021-07-31 DIAGNOSIS — J90 Pleural effusion, not elsewhere classified: Secondary | ICD-10-CM | POA: Diagnosis not present

## 2021-07-31 DIAGNOSIS — J9811 Atelectasis: Secondary | ICD-10-CM | POA: Diagnosis not present

## 2021-07-31 DIAGNOSIS — R571 Hypovolemic shock: Secondary | ICD-10-CM | POA: Diagnosis not present

## 2021-08-01 DIAGNOSIS — J90 Pleural effusion, not elsewhere classified: Secondary | ICD-10-CM | POA: Diagnosis not present

## 2021-08-01 DIAGNOSIS — R6510 Systemic inflammatory response syndrome (SIRS) of non-infectious origin without acute organ dysfunction: Secondary | ICD-10-CM | POA: Diagnosis not present

## 2021-08-01 DIAGNOSIS — R000 Tachycardia, unspecified: Secondary | ICD-10-CM | POA: Diagnosis not present

## 2021-08-01 DIAGNOSIS — R0689 Other abnormalities of breathing: Secondary | ICD-10-CM | POA: Diagnosis not present

## 2021-08-01 DIAGNOSIS — R571 Hypovolemic shock: Secondary | ICD-10-CM | POA: Diagnosis not present

## 2021-08-01 DIAGNOSIS — Z9911 Dependence on respirator [ventilator] status: Secondary | ICD-10-CM | POA: Diagnosis not present

## 2021-08-02 DIAGNOSIS — D689 Coagulation defect, unspecified: Secondary | ICD-10-CM | POA: Diagnosis not present

## 2021-08-02 DIAGNOSIS — Z4682 Encounter for fitting and adjustment of non-vascular catheter: Secondary | ICD-10-CM | POA: Diagnosis not present

## 2021-08-02 DIAGNOSIS — R0689 Other abnormalities of breathing: Secondary | ICD-10-CM | POA: Diagnosis not present

## 2021-08-02 DIAGNOSIS — Z9889 Other specified postprocedural states: Secondary | ICD-10-CM | POA: Diagnosis not present

## 2021-08-02 DIAGNOSIS — R6510 Systemic inflammatory response syndrome (SIRS) of non-infectious origin without acute organ dysfunction: Secondary | ICD-10-CM | POA: Diagnosis not present

## 2021-08-02 DIAGNOSIS — D62 Acute posthemorrhagic anemia: Secondary | ICD-10-CM | POA: Diagnosis not present

## 2021-08-02 DIAGNOSIS — J90 Pleural effusion, not elsewhere classified: Secondary | ICD-10-CM | POA: Diagnosis not present

## 2021-08-03 DIAGNOSIS — R6510 Systemic inflammatory response syndrome (SIRS) of non-infectious origin without acute organ dysfunction: Secondary | ICD-10-CM | POA: Diagnosis not present

## 2021-08-03 DIAGNOSIS — D689 Coagulation defect, unspecified: Secondary | ICD-10-CM | POA: Diagnosis not present

## 2021-08-03 DIAGNOSIS — R0689 Other abnormalities of breathing: Secondary | ICD-10-CM | POA: Diagnosis not present

## 2021-08-03 DIAGNOSIS — D62 Acute posthemorrhagic anemia: Secondary | ICD-10-CM | POA: Diagnosis not present

## 2021-08-04 DIAGNOSIS — R748 Abnormal levels of other serum enzymes: Secondary | ICD-10-CM | POA: Diagnosis not present

## 2021-08-04 DIAGNOSIS — R7881 Bacteremia: Secondary | ICD-10-CM | POA: Diagnosis not present

## 2021-08-04 DIAGNOSIS — R451 Restlessness and agitation: Secondary | ICD-10-CM | POA: Diagnosis not present

## 2021-08-04 DIAGNOSIS — R739 Hyperglycemia, unspecified: Secondary | ICD-10-CM | POA: Diagnosis not present

## 2021-08-05 DIAGNOSIS — A419 Sepsis, unspecified organism: Secondary | ICD-10-CM | POA: Diagnosis not present

## 2021-08-05 DIAGNOSIS — R739 Hyperglycemia, unspecified: Secondary | ICD-10-CM | POA: Diagnosis not present

## 2021-08-05 DIAGNOSIS — R6521 Severe sepsis with septic shock: Secondary | ICD-10-CM | POA: Diagnosis not present

## 2021-08-05 DIAGNOSIS — S02401A Maxillary fracture, unspecified, initial encounter for closed fracture: Secondary | ICD-10-CM | POA: Diagnosis not present

## 2021-08-06 DIAGNOSIS — S2243XA Multiple fractures of ribs, bilateral, initial encounter for closed fracture: Secondary | ICD-10-CM | POA: Diagnosis not present

## 2021-08-06 DIAGNOSIS — G479 Sleep disorder, unspecified: Secondary | ICD-10-CM | POA: Diagnosis not present

## 2021-08-06 DIAGNOSIS — A419 Sepsis, unspecified organism: Secondary | ICD-10-CM | POA: Diagnosis not present

## 2021-08-06 DIAGNOSIS — R451 Restlessness and agitation: Secondary | ICD-10-CM | POA: Diagnosis not present

## 2021-08-06 DIAGNOSIS — R410 Disorientation, unspecified: Secondary | ICD-10-CM | POA: Diagnosis not present

## 2021-08-06 DIAGNOSIS — S02401A Maxillary fracture, unspecified, initial encounter for closed fracture: Secondary | ICD-10-CM | POA: Diagnosis not present

## 2021-08-06 DIAGNOSIS — R6510 Systemic inflammatory response syndrome (SIRS) of non-infectious origin without acute organ dysfunction: Secondary | ICD-10-CM | POA: Diagnosis not present

## 2021-08-07 DIAGNOSIS — R739 Hyperglycemia, unspecified: Secondary | ICD-10-CM | POA: Diagnosis not present

## 2021-08-07 DIAGNOSIS — S02401A Maxillary fracture, unspecified, initial encounter for closed fracture: Secondary | ICD-10-CM | POA: Diagnosis not present

## 2021-08-07 DIAGNOSIS — R7881 Bacteremia: Secondary | ICD-10-CM | POA: Diagnosis not present

## 2021-08-07 DIAGNOSIS — S2243XA Multiple fractures of ribs, bilateral, initial encounter for closed fracture: Secondary | ICD-10-CM | POA: Diagnosis not present

## 2021-08-08 DIAGNOSIS — S02401A Maxillary fracture, unspecified, initial encounter for closed fracture: Secondary | ICD-10-CM | POA: Diagnosis not present

## 2021-08-08 DIAGNOSIS — K661 Hemoperitoneum: Secondary | ICD-10-CM | POA: Diagnosis not present

## 2021-08-08 DIAGNOSIS — S0990XA Unspecified injury of head, initial encounter: Secondary | ICD-10-CM | POA: Diagnosis not present

## 2021-08-08 DIAGNOSIS — S2243XA Multiple fractures of ribs, bilateral, initial encounter for closed fracture: Secondary | ICD-10-CM | POA: Diagnosis not present

## 2021-08-08 DIAGNOSIS — R739 Hyperglycemia, unspecified: Secondary | ICD-10-CM | POA: Diagnosis not present

## 2021-08-08 DIAGNOSIS — S40819A Abrasion of unspecified upper arm, initial encounter: Secondary | ICD-10-CM | POA: Diagnosis not present

## 2021-08-08 DIAGNOSIS — R7881 Bacteremia: Secondary | ICD-10-CM | POA: Diagnosis not present

## 2021-08-09 DIAGNOSIS — R451 Restlessness and agitation: Secondary | ICD-10-CM | POA: Diagnosis not present

## 2021-08-09 DIAGNOSIS — R410 Disorientation, unspecified: Secondary | ICD-10-CM | POA: Diagnosis not present

## 2021-08-09 DIAGNOSIS — S2243XA Multiple fractures of ribs, bilateral, initial encounter for closed fracture: Secondary | ICD-10-CM | POA: Diagnosis not present

## 2021-08-09 DIAGNOSIS — R7881 Bacteremia: Secondary | ICD-10-CM | POA: Diagnosis not present

## 2021-08-09 DIAGNOSIS — R739 Hyperglycemia, unspecified: Secondary | ICD-10-CM | POA: Diagnosis not present

## 2021-08-09 DIAGNOSIS — S02401A Maxillary fracture, unspecified, initial encounter for closed fracture: Secondary | ICD-10-CM | POA: Diagnosis not present

## 2021-08-10 DIAGNOSIS — R451 Restlessness and agitation: Secondary | ICD-10-CM | POA: Diagnosis not present

## 2021-08-10 DIAGNOSIS — R509 Fever, unspecified: Secondary | ICD-10-CM | POA: Diagnosis not present

## 2021-08-10 DIAGNOSIS — R739 Hyperglycemia, unspecified: Secondary | ICD-10-CM | POA: Diagnosis not present

## 2021-08-10 DIAGNOSIS — S2243XA Multiple fractures of ribs, bilateral, initial encounter for closed fracture: Secondary | ICD-10-CM | POA: Diagnosis not present

## 2021-08-10 DIAGNOSIS — R7881 Bacteremia: Secondary | ICD-10-CM | POA: Diagnosis not present

## 2021-08-10 DIAGNOSIS — S02401A Maxillary fracture, unspecified, initial encounter for closed fracture: Secondary | ICD-10-CM | POA: Diagnosis not present

## 2021-08-10 DIAGNOSIS — J90 Pleural effusion, not elsewhere classified: Secondary | ICD-10-CM | POA: Diagnosis not present

## 2021-08-11 DIAGNOSIS — K82A2 Perforation of gallbladder in cholecystitis: Secondary | ICD-10-CM | POA: Diagnosis not present

## 2021-08-11 DIAGNOSIS — R188 Other ascites: Secondary | ICD-10-CM | POA: Diagnosis not present

## 2021-08-11 DIAGNOSIS — R109 Unspecified abdominal pain: Secondary | ICD-10-CM | POA: Diagnosis not present

## 2021-08-11 DIAGNOSIS — R739 Hyperglycemia, unspecified: Secondary | ICD-10-CM | POA: Diagnosis not present

## 2021-08-11 DIAGNOSIS — R748 Abnormal levels of other serum enzymes: Secondary | ICD-10-CM | POA: Diagnosis not present

## 2021-08-11 DIAGNOSIS — K651 Peritoneal abscess: Secondary | ICD-10-CM | POA: Diagnosis not present

## 2021-08-11 DIAGNOSIS — B9689 Other specified bacterial agents as the cause of diseases classified elsewhere: Secondary | ICD-10-CM | POA: Diagnosis not present

## 2021-08-11 DIAGNOSIS — K838 Other specified diseases of biliary tract: Secondary | ICD-10-CM | POA: Diagnosis not present

## 2021-08-11 DIAGNOSIS — K819 Cholecystitis, unspecified: Secondary | ICD-10-CM | POA: Diagnosis not present

## 2021-08-11 DIAGNOSIS — R7881 Bacteremia: Secondary | ICD-10-CM | POA: Diagnosis not present

## 2021-08-11 DIAGNOSIS — R7401 Elevation of levels of liver transaminase levels: Secondary | ICD-10-CM | POA: Diagnosis not present

## 2021-08-11 DIAGNOSIS — S2243XA Multiple fractures of ribs, bilateral, initial encounter for closed fracture: Secondary | ICD-10-CM | POA: Diagnosis not present

## 2021-08-11 DIAGNOSIS — S02401A Maxillary fracture, unspecified, initial encounter for closed fracture: Secondary | ICD-10-CM | POA: Diagnosis not present

## 2021-08-11 DIAGNOSIS — Z9049 Acquired absence of other specified parts of digestive tract: Secondary | ICD-10-CM | POA: Diagnosis not present

## 2021-08-11 DIAGNOSIS — J90 Pleural effusion, not elsewhere classified: Secondary | ICD-10-CM | POA: Diagnosis not present

## 2021-08-11 DIAGNOSIS — J9811 Atelectasis: Secondary | ICD-10-CM | POA: Diagnosis not present

## 2021-08-11 DIAGNOSIS — K822 Perforation of gallbladder: Secondary | ICD-10-CM | POA: Diagnosis not present

## 2021-08-12 DIAGNOSIS — S40819A Abrasion of unspecified upper arm, initial encounter: Secondary | ICD-10-CM | POA: Diagnosis not present

## 2021-08-12 DIAGNOSIS — S0990XA Unspecified injury of head, initial encounter: Secondary | ICD-10-CM | POA: Diagnosis not present

## 2021-08-12 DIAGNOSIS — R739 Hyperglycemia, unspecified: Secondary | ICD-10-CM | POA: Diagnosis not present

## 2021-08-12 DIAGNOSIS — K810 Acute cholecystitis: Secondary | ICD-10-CM | POA: Diagnosis not present

## 2021-08-12 DIAGNOSIS — R7881 Bacteremia: Secondary | ICD-10-CM | POA: Diagnosis not present

## 2021-08-12 DIAGNOSIS — K661 Hemoperitoneum: Secondary | ICD-10-CM | POA: Diagnosis not present

## 2021-08-12 DIAGNOSIS — S2243XA Multiple fractures of ribs, bilateral, initial encounter for closed fracture: Secondary | ICD-10-CM | POA: Diagnosis not present

## 2021-08-12 DIAGNOSIS — S02401A Maxillary fracture, unspecified, initial encounter for closed fracture: Secondary | ICD-10-CM | POA: Diagnosis not present

## 2021-08-13 DIAGNOSIS — S2243XA Multiple fractures of ribs, bilateral, initial encounter for closed fracture: Secondary | ICD-10-CM | POA: Diagnosis not present

## 2021-08-13 DIAGNOSIS — G8911 Acute pain due to trauma: Secondary | ICD-10-CM | POA: Diagnosis not present

## 2021-08-13 DIAGNOSIS — R7881 Bacteremia: Secondary | ICD-10-CM | POA: Diagnosis not present

## 2021-08-13 DIAGNOSIS — R739 Hyperglycemia, unspecified: Secondary | ICD-10-CM | POA: Diagnosis not present

## 2021-08-14 DIAGNOSIS — S02401A Maxillary fracture, unspecified, initial encounter for closed fracture: Secondary | ICD-10-CM | POA: Diagnosis not present

## 2021-08-14 DIAGNOSIS — S2243XA Multiple fractures of ribs, bilateral, initial encounter for closed fracture: Secondary | ICD-10-CM | POA: Diagnosis not present

## 2021-08-14 DIAGNOSIS — R7881 Bacteremia: Secondary | ICD-10-CM | POA: Diagnosis not present

## 2021-08-14 DIAGNOSIS — R739 Hyperglycemia, unspecified: Secondary | ICD-10-CM | POA: Diagnosis not present

## 2021-08-15 DIAGNOSIS — S02401A Maxillary fracture, unspecified, initial encounter for closed fracture: Secondary | ICD-10-CM | POA: Diagnosis not present

## 2021-08-15 DIAGNOSIS — F05 Delirium due to known physiological condition: Secondary | ICD-10-CM | POA: Diagnosis not present

## 2021-08-15 DIAGNOSIS — S2243XA Multiple fractures of ribs, bilateral, initial encounter for closed fracture: Secondary | ICD-10-CM | POA: Diagnosis not present

## 2021-08-15 DIAGNOSIS — F32A Depression, unspecified: Secondary | ICD-10-CM | POA: Diagnosis not present

## 2021-08-15 DIAGNOSIS — R739 Hyperglycemia, unspecified: Secondary | ICD-10-CM | POA: Diagnosis not present

## 2021-08-15 DIAGNOSIS — G479 Sleep disorder, unspecified: Secondary | ICD-10-CM | POA: Diagnosis not present

## 2021-08-15 DIAGNOSIS — R7881 Bacteremia: Secondary | ICD-10-CM | POA: Diagnosis not present

## 2021-08-16 DIAGNOSIS — R739 Hyperglycemia, unspecified: Secondary | ICD-10-CM | POA: Diagnosis not present

## 2021-08-16 DIAGNOSIS — S2243XA Multiple fractures of ribs, bilateral, initial encounter for closed fracture: Secondary | ICD-10-CM | POA: Diagnosis not present

## 2021-08-16 DIAGNOSIS — Z4682 Encounter for fitting and adjustment of non-vascular catheter: Secondary | ICD-10-CM | POA: Diagnosis not present

## 2021-08-16 DIAGNOSIS — R7881 Bacteremia: Secondary | ICD-10-CM | POA: Diagnosis not present

## 2021-08-16 DIAGNOSIS — R633 Feeding difficulties, unspecified: Secondary | ICD-10-CM | POA: Diagnosis not present

## 2021-08-16 DIAGNOSIS — S36112A Contusion of liver, initial encounter: Secondary | ICD-10-CM | POA: Diagnosis not present

## 2021-08-16 DIAGNOSIS — Z9889 Other specified postprocedural states: Secondary | ICD-10-CM | POA: Diagnosis not present

## 2021-08-16 DIAGNOSIS — Z9049 Acquired absence of other specified parts of digestive tract: Secondary | ICD-10-CM | POA: Diagnosis not present

## 2021-08-16 DIAGNOSIS — R6339 Other feeding difficulties: Secondary | ICD-10-CM | POA: Diagnosis not present

## 2021-08-17 DIAGNOSIS — R6339 Other feeding difficulties: Secondary | ICD-10-CM | POA: Diagnosis not present

## 2021-08-17 DIAGNOSIS — Z431 Encounter for attention to gastrostomy: Secondary | ICD-10-CM | POA: Diagnosis not present

## 2021-08-17 DIAGNOSIS — S0990XA Unspecified injury of head, initial encounter: Secondary | ICD-10-CM | POA: Diagnosis not present

## 2021-08-17 DIAGNOSIS — K3189 Other diseases of stomach and duodenum: Secondary | ICD-10-CM | POA: Diagnosis not present

## 2021-08-18 DIAGNOSIS — R6339 Other feeding difficulties: Secondary | ICD-10-CM | POA: Diagnosis not present

## 2021-08-18 DIAGNOSIS — Z978 Presence of other specified devices: Secondary | ICD-10-CM | POA: Diagnosis not present

## 2021-08-20 DIAGNOSIS — N2889 Other specified disorders of kidney and ureter: Secondary | ICD-10-CM | POA: Diagnosis not present

## 2021-08-20 DIAGNOSIS — R7881 Bacteremia: Secondary | ICD-10-CM | POA: Diagnosis not present

## 2021-08-20 DIAGNOSIS — R739 Hyperglycemia, unspecified: Secondary | ICD-10-CM | POA: Diagnosis not present

## 2021-08-20 DIAGNOSIS — N281 Cyst of kidney, acquired: Secondary | ICD-10-CM | POA: Diagnosis not present

## 2021-08-20 DIAGNOSIS — S2243XA Multiple fractures of ribs, bilateral, initial encounter for closed fracture: Secondary | ICD-10-CM | POA: Diagnosis not present

## 2021-08-20 DIAGNOSIS — S02401A Maxillary fracture, unspecified, initial encounter for closed fracture: Secondary | ICD-10-CM | POA: Diagnosis not present

## 2021-08-21 DIAGNOSIS — S02401A Maxillary fracture, unspecified, initial encounter for closed fracture: Secondary | ICD-10-CM | POA: Diagnosis not present

## 2021-08-21 DIAGNOSIS — R739 Hyperglycemia, unspecified: Secondary | ICD-10-CM | POA: Diagnosis not present

## 2021-08-21 DIAGNOSIS — R7881 Bacteremia: Secondary | ICD-10-CM | POA: Diagnosis not present

## 2021-08-21 DIAGNOSIS — S2243XA Multiple fractures of ribs, bilateral, initial encounter for closed fracture: Secondary | ICD-10-CM | POA: Diagnosis not present

## 2021-08-22 DIAGNOSIS — S2243XA Multiple fractures of ribs, bilateral, initial encounter for closed fracture: Secondary | ICD-10-CM | POA: Diagnosis not present

## 2021-08-22 DIAGNOSIS — R739 Hyperglycemia, unspecified: Secondary | ICD-10-CM | POA: Diagnosis not present

## 2021-08-22 DIAGNOSIS — S02401A Maxillary fracture, unspecified, initial encounter for closed fracture: Secondary | ICD-10-CM | POA: Diagnosis not present

## 2021-08-22 DIAGNOSIS — R7881 Bacteremia: Secondary | ICD-10-CM | POA: Diagnosis not present

## 2021-08-24 DIAGNOSIS — N281 Cyst of kidney, acquired: Secondary | ICD-10-CM | POA: Diagnosis not present

## 2021-08-24 DIAGNOSIS — R188 Other ascites: Secondary | ICD-10-CM | POA: Diagnosis not present

## 2021-08-24 DIAGNOSIS — S2241XA Multiple fractures of ribs, right side, initial encounter for closed fracture: Secondary | ICD-10-CM | POA: Diagnosis not present

## 2021-08-26 DIAGNOSIS — S40819A Abrasion of unspecified upper arm, initial encounter: Secondary | ICD-10-CM | POA: Diagnosis not present

## 2021-08-26 DIAGNOSIS — S0990XA Unspecified injury of head, initial encounter: Secondary | ICD-10-CM | POA: Diagnosis not present

## 2021-08-26 DIAGNOSIS — K661 Hemoperitoneum: Secondary | ICD-10-CM | POA: Diagnosis not present

## 2021-08-27 DIAGNOSIS — F05 Delirium due to known physiological condition: Secondary | ICD-10-CM | POA: Diagnosis not present

## 2021-08-27 DIAGNOSIS — R451 Restlessness and agitation: Secondary | ICD-10-CM | POA: Diagnosis not present

## 2021-08-27 DIAGNOSIS — G4720 Circadian rhythm sleep disorder, unspecified type: Secondary | ICD-10-CM | POA: Diagnosis not present

## 2021-08-27 DIAGNOSIS — Z1339 Encounter for screening examination for other mental health and behavioral disorders: Secondary | ICD-10-CM | POA: Diagnosis not present

## 2021-09-01 DIAGNOSIS — Z932 Ileostomy status: Secondary | ICD-10-CM | POA: Diagnosis not present

## 2021-09-01 DIAGNOSIS — S271XXD Traumatic hemothorax, subsequent encounter: Secondary | ICD-10-CM | POA: Diagnosis not present

## 2021-09-01 DIAGNOSIS — S2243XD Multiple fractures of ribs, bilateral, subsequent encounter for fracture with routine healing: Secondary | ICD-10-CM | POA: Diagnosis not present

## 2021-09-01 DIAGNOSIS — T8144XD Sepsis following a procedure, subsequent encounter: Secondary | ICD-10-CM | POA: Diagnosis not present

## 2021-09-01 DIAGNOSIS — K82A2 Perforation of gallbladder in cholecystitis: Secondary | ICD-10-CM | POA: Diagnosis not present

## 2021-09-05 ENCOUNTER — Inpatient Hospital Stay (HOSPITAL_COMMUNITY): Payer: PPO

## 2021-09-05 ENCOUNTER — Inpatient Hospital Stay (HOSPITAL_COMMUNITY)
Admission: EM | Admit: 2021-09-05 | Discharge: 2021-09-08 | DRG: 371 | Disposition: A | Discharge status: Home Health | Payer: PPO | Attending: Family Medicine | Admitting: Family Medicine

## 2021-09-05 ENCOUNTER — Emergency Department (HOSPITAL_COMMUNITY): Payer: PPO

## 2021-09-05 DIAGNOSIS — S83411D Sprain of medial collateral ligament of right knee, subsequent encounter: Secondary | ICD-10-CM

## 2021-09-05 DIAGNOSIS — E871 Hypo-osmolality and hyponatremia: Secondary | ICD-10-CM | POA: Diagnosis not present

## 2021-09-05 DIAGNOSIS — R109 Unspecified abdominal pain: Secondary | ICD-10-CM | POA: Diagnosis not present

## 2021-09-05 DIAGNOSIS — E785 Hyperlipidemia, unspecified: Secondary | ICD-10-CM | POA: Diagnosis not present

## 2021-09-05 DIAGNOSIS — R739 Hyperglycemia, unspecified: Secondary | ICD-10-CM | POA: Diagnosis present

## 2021-09-05 DIAGNOSIS — D72829 Elevated white blood cell count, unspecified: Secondary | ICD-10-CM | POA: Diagnosis present

## 2021-09-05 DIAGNOSIS — M25571 Pain in right ankle and joints of right foot: Secondary | ICD-10-CM | POA: Diagnosis not present

## 2021-09-05 DIAGNOSIS — Z8619 Personal history of other infectious and parasitic diseases: Secondary | ICD-10-CM

## 2021-09-05 DIAGNOSIS — E781 Pure hyperglyceridemia: Secondary | ICD-10-CM | POA: Diagnosis not present

## 2021-09-05 DIAGNOSIS — M25561 Pain in right knee: Principal | ICD-10-CM | POA: Diagnosis present

## 2021-09-05 DIAGNOSIS — Z95828 Presence of other vascular implants and grafts: Secondary | ICD-10-CM | POA: Diagnosis not present

## 2021-09-05 DIAGNOSIS — Z9049 Acquired absence of other specified parts of digestive tract: Secondary | ICD-10-CM | POA: Diagnosis not present

## 2021-09-05 DIAGNOSIS — Z932 Ileostomy status: Secondary | ICD-10-CM | POA: Diagnosis not present

## 2021-09-05 DIAGNOSIS — R7881 Bacteremia: Secondary | ICD-10-CM | POA: Diagnosis present

## 2021-09-05 DIAGNOSIS — Z833 Family history of diabetes mellitus: Secondary | ICD-10-CM

## 2021-09-05 DIAGNOSIS — Z8249 Family history of ischemic heart disease and other diseases of the circulatory system: Secondary | ICD-10-CM

## 2021-09-05 DIAGNOSIS — S93401D Sprain of unspecified ligament of right ankle, subsequent encounter: Secondary | ICD-10-CM

## 2021-09-05 DIAGNOSIS — K651 Peritoneal abscess: Secondary | ICD-10-CM | POA: Diagnosis not present

## 2021-09-05 DIAGNOSIS — R1012 Left upper quadrant pain: Secondary | ICD-10-CM | POA: Diagnosis not present

## 2021-09-05 DIAGNOSIS — B9620 Unspecified Escherichia coli [E. coli] as the cause of diseases classified elsewhere: Secondary | ICD-10-CM | POA: Diagnosis present

## 2021-09-05 DIAGNOSIS — E43 Unspecified severe protein-calorie malnutrition: Secondary | ICD-10-CM | POA: Diagnosis present

## 2021-09-05 DIAGNOSIS — Z87891 Personal history of nicotine dependence: Secondary | ICD-10-CM

## 2021-09-05 DIAGNOSIS — R262 Difficulty in walking, not elsewhere classified: Secondary | ICD-10-CM | POA: Diagnosis present

## 2021-09-05 DIAGNOSIS — N281 Cyst of kidney, acquired: Secondary | ICD-10-CM | POA: Diagnosis not present

## 2021-09-05 DIAGNOSIS — J90 Pleural effusion, not elsewhere classified: Secondary | ICD-10-CM | POA: Diagnosis not present

## 2021-09-05 DIAGNOSIS — M1711 Unilateral primary osteoarthritis, right knee: Secondary | ICD-10-CM | POA: Diagnosis not present

## 2021-09-05 DIAGNOSIS — Z791 Long term (current) use of non-steroidal anti-inflammatories (NSAID): Secondary | ICD-10-CM

## 2021-09-05 DIAGNOSIS — R000 Tachycardia, unspecified: Secondary | ICD-10-CM | POA: Diagnosis not present

## 2021-09-05 DIAGNOSIS — R112 Nausea with vomiting, unspecified: Secondary | ICD-10-CM | POA: Diagnosis not present

## 2021-09-05 DIAGNOSIS — Z6823 Body mass index (BMI) 23.0-23.9, adult: Secondary | ICD-10-CM | POA: Diagnosis not present

## 2021-09-05 LAB — COMPREHENSIVE METABOLIC PANEL
ALT: 30 U/L (ref 0–44)
AST: 25 U/L (ref 15–41)
Albumin: 3 g/dL — ABNORMAL LOW (ref 3.5–5.0)
Alkaline Phosphatase: 115 U/L (ref 38–126)
Anion gap: 9 (ref 5–15)
BUN: 20 mg/dL (ref 8–23)
CO2: 24 mmol/L (ref 22–32)
Calcium: 10.4 mg/dL — ABNORMAL HIGH (ref 8.9–10.3)
Chloride: 96 mmol/L — ABNORMAL LOW (ref 98–111)
Creatinine, Ser: 0.67 mg/dL (ref 0.61–1.24)
GFR, Estimated: >60 mL/min (ref >60)
Glucose, Bld: 170 mg/dL — ABNORMAL HIGH (ref 70–99)
Potassium: 4.9 mmol/L (ref 3.5–5.1)
Sodium: 129 mmol/L — ABNORMAL LOW (ref 135–145)
Total Bilirubin: 0.6 mg/dL (ref 0.3–1.2)
Total Protein: 7.9 g/dL (ref 6.5–8.1)

## 2021-09-05 LAB — CBC WITH DIFFERENTIAL/PLATELET
Abs Immature Granulocytes: 0.1 10*3/uL — ABNORMAL HIGH (ref 0.00–0.07)
Basophils Absolute: 0.1 10*3/uL (ref 0.0–0.1)
Basophils Relative: 0 %
Eosinophils Absolute: 0.1 10*3/uL (ref 0.0–0.5)
Eosinophils Relative: 0 %
HCT: 39.3 % (ref 39.0–52.0)
Hemoglobin: 12.7 g/dL — ABNORMAL LOW (ref 13.0–17.0)
Immature Granulocytes: 1 %
Lymphocytes Relative: 36 %
Lymphs Abs: 4.4 10*3/uL — ABNORMAL HIGH (ref 0.7–4.0)
MCH: 26.4 pg (ref 26.0–34.0)
MCHC: 32.3 g/dL (ref 30.0–36.0)
MCV: 81.7 fL (ref 80.0–100.0)
Monocytes Absolute: 1 10*3/uL (ref 0.1–1.0)
Monocytes Relative: 8 %
Neutro Abs: 6.7 10*3/uL (ref 1.7–7.7)
Neutrophils Relative %: 55 %
Platelets: 558 10*3/uL — ABNORMAL HIGH (ref 150–400)
RBC: 4.81 MIL/uL (ref 4.22–5.81)
RDW: 15 % (ref 11.5–15.5)
WBC: 12.3 10*3/uL — ABNORMAL HIGH (ref 4.0–10.5)
nRBC: 0 % (ref 0.0–0.2)

## 2021-09-05 LAB — LIPASE, BLOOD: Lipase: 37 U/L (ref 11–51)

## 2021-09-05 LAB — URINALYSIS, ROUTINE W REFLEX MICROSCOPIC
Bilirubin Urine: NEGATIVE
Glucose, UA: NEGATIVE mg/dL
Hgb urine dipstick: NEGATIVE
Ketones, ur: NEGATIVE mg/dL
Leukocytes,Ua: NEGATIVE
Nitrite: NEGATIVE
Protein, ur: NEGATIVE mg/dL
Specific Gravity, Urine: 1.032 — ABNORMAL HIGH (ref 1.005–1.030)
pH: 5 (ref 5.0–8.0)

## 2021-09-05 LAB — CBG MONITORING, ED: Glucose-Capillary: 149 mg/dL — ABNORMAL HIGH (ref 70–99)

## 2021-09-05 MED ORDER — HYDROMORPHONE HCL 1 MG/ML IJ SOLN: 0.5000 mg | INTRAMUSCULAR | Status: DC | PRN

## 2021-09-05 MED ORDER — SODIUM CHLORIDE 0.9% FLUSH: 10.0000 mL | INTRAVENOUS | Status: DC | PRN

## 2021-09-05 MED ORDER — SODIUM CHLORIDE 0.9 % IV SOLN
2.0000 g | Freq: Once | INTRAVENOUS | Status: AC
  Administered 2021-09-05: 2 g via INTRAVENOUS
  Filled 2021-09-05: qty 20

## 2021-09-05 MED ORDER — ACETAMINOPHEN 325 MG PO TABS
650.0000 mg | ORAL_TABLET | Freq: Four times a day (QID) | ORAL | Status: DC | PRN
  Administered 2021-09-06: 650 mg via ORAL
  Filled 2021-09-05: qty 2

## 2021-09-05 MED ORDER — OSMOLITE 1.5 CAL PO LIQD
1000.0000 mL | Freq: Four times a day (QID) | ORAL | Status: DC
  Filled 2021-09-05: qty 1000

## 2021-09-05 MED ORDER — FREE WATER
100.0000 mL | Freq: Three times a day (TID) | Status: DC
  Administered 2021-09-05 – 2021-09-08 (×8): 100 mL

## 2021-09-05 MED ORDER — OXYCODONE HCL 5 MG PO TABS
5.0000 mg | ORAL_TABLET | Freq: Four times a day (QID) | ORAL | Status: DC | PRN
  Administered 2021-09-06: 5 mg via ORAL
  Filled 2021-09-05: qty 1

## 2021-09-05 MED ORDER — BOOST / RESOURCE BREEZE PO LIQD CUSTOM
1.0000 | Freq: Three times a day (TID) | ORAL | Status: DC
  Filled 2021-09-05: qty 1

## 2021-09-05 MED ORDER — SODIUM CHLORIDE 0.9 % IV SOLN
2.0000 g | INTRAVENOUS | Status: DC
  Administered 2021-09-06 – 2021-09-07 (×2): 2 g via INTRAVENOUS
  Filled 2021-09-05 (×2): qty 20

## 2021-09-05 MED ORDER — INSULIN ASPART 100 UNIT/ML IJ SOLN
0.0000 [IU] | Freq: Three times a day (TID) | INTRAMUSCULAR | Status: DC
  Administered 2021-09-06 (×2): 1 [IU] via SUBCUTANEOUS
  Administered 2021-09-07 (×2): 2 [IU] via SUBCUTANEOUS
  Administered 2021-09-08: 1 [IU] via SUBCUTANEOUS
  Administered 2021-09-08: 3 [IU] via SUBCUTANEOUS

## 2021-09-05 MED ORDER — SODIUM CHLORIDE 0.9 % IV BOLUS
1000.0000 mL | Freq: Once | INTRAVENOUS | Status: AC
  Administered 2021-09-05: 1000 mL via INTRAVENOUS

## 2021-09-05 MED ORDER — MELATONIN 5 MG PO TABS: 5.0000 mg | ORAL_TABLET | Freq: Every evening | ORAL | Status: DC | PRN

## 2021-09-05 MED ORDER — IOHEXOL 300 MG/ML  SOLN
100.0000 mL | Freq: Once | INTRAMUSCULAR | Status: AC | PRN
  Administered 2021-09-05: 100 mL via INTRAVENOUS

## 2021-09-05 MED ORDER — ENOXAPARIN SODIUM 40 MG/0.4ML IJ SOSY
40.0000 mg | PREFILLED_SYRINGE | INTRAMUSCULAR | Status: DC
Start: 2021-09-05 — End: 2021-09-08
  Administered 2021-09-06 – 2021-09-07 (×2): 40 mg via SUBCUTANEOUS
  Filled 2021-09-05 (×2): qty 0.4

## 2021-09-05 MED ORDER — METRONIDAZOLE 500 MG PO TABS
500.0000 mg | ORAL_TABLET | Freq: Once | ORAL | Status: DC
  Filled 2021-09-05: qty 1

## 2021-09-05 MED ORDER — VITAL HIGH PROTEIN PO LIQD: 1000.0000 mL | ORAL | Status: DC

## 2021-09-05 MED ORDER — METRONIDAZOLE 500 MG/100ML IV SOLN
500.0000 mg | Freq: Three times a day (TID) | INTRAVENOUS | Status: DC
  Administered 2021-09-06: 500 mg via INTRAVENOUS
  Filled 2021-09-05: qty 100

## 2021-09-05 MED ORDER — PROCHLORPERAZINE EDISYLATE 10 MG/2ML IJ SOLN
10.0000 mg | Freq: Four times a day (QID) | INTRAMUSCULAR | Status: DC | PRN
  Administered 2021-09-07 – 2021-09-08 (×2): 10 mg via INTRAVENOUS
  Filled 2021-09-05 (×2): qty 2

## 2021-09-05 MED ORDER — METRONIDAZOLE 500 MG/100ML IV SOLN
500.0000 mg | Freq: Once | INTRAVENOUS | Status: AC
  Administered 2021-09-05: 500 mg via INTRAVENOUS
  Filled 2021-09-05: qty 100

## 2021-09-05 MED ORDER — CHLORHEXIDINE GLUCONATE CLOTH 2 % EX PADS
6.0000 | MEDICATED_PAD | Freq: Every day | CUTANEOUS | Status: DC
  Administered 2021-09-06 – 2021-09-08 (×3): 6 via TOPICAL

## 2021-09-05 MED ORDER — INSULIN ASPART 100 UNIT/ML IJ SOLN: 0.0000 [IU] | Freq: Every day | INTRAMUSCULAR | Status: DC

## 2021-09-05 MED ORDER — SODIUM CHLORIDE 0.9 % IV SOLN
INTRAVENOUS | Status: DC
Start: 2021-09-05 — End: 2021-09-06

## 2021-09-05 MED ORDER — POLYETHYLENE GLYCOL 3350 17 G PO PACK: 17.0000 g | PACK | Freq: Every day | ORAL | Status: DC | PRN

## 2021-09-05 NOTE — ED Provider Notes (Signed)
Pt signed out by Dr. Sabra Heck pending CT scans.  CT scan reviewed by me.  I agree with the radiologist.  IMPRESSION:  Postoperative changes from partial colectomy. Stranding in the  peritoneum adjacent to the mid transverse colon operative site, but  likely expected postoperative changes. No free air or focal fluid  collection.    Cholecystostomy tube and gastrostomy tube in place.    Small right pleural effusion. Right lower lobe atelectasis or  infiltrate.    Right lower quadrant ostomy, grossly unremarkable.    It took the Network engineer several hours and multiple phone calls to [facility], but we have finally received his d/c summary.  Photos taken and put in the media section.    Pt is supposed to get 2 g Rocephin daily and flagyl 500 mg q8h for at least 11 days after d/c which was on 7/22.  He is supposed to get a repeat CT scan on 8/1 which will give final stop date.    I have consulted TOC to see if they can help with [facility] and to get pt his abx that he needs.  Pt is supposed to be on osmolite 1.5 tube feeds (295 ml 5 times per day and 60 ml tube feed flush before and after tube feedings. + boost/ensure prn  Pt has an appt with ID on 8/1 and repeat CT scan scheduled on 7/28  Both drains need to be flushed BID.  He has a drain study scheduled with IR on 8/14.  He has a f/u appt with surgery on 8/17.  The case manager said she can arrange for everything, but it may take several days.  She asks if we can admit him to help facilitate everything, get some pt, and everything else he needs.  Dr. [place] Crane (triad) will admit.    Isla Pence, MD 09/05/21 2007

## 2021-09-05 NOTE — ED Notes (Signed)
Patient transported to CT 
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
## 2021-09-05 NOTE — Care Management (Signed)
ED RNCM consulted, patient was  was discharged from [facility]  7/22 to SNF on IV Antibiotics,, G -Tube with 5 x daily feedings with  Cholecystostomy drains.  Patient's family took patient home after 2 days citing patient was not receiving adequate care.  Patient has not had antibiotics in 2 days .   Family brought patient to [facility] ED, RNCM meet with patient and son Chandlor Noecker  [phone] in [place] bed 11 to discuss goals of care.  Patient and son verbalized getting patient back home to receive home IV infusion with HH.  Explained to patient due to it being after hours TOC would not be able to coordinate this until tomorrow, pending insurance approval.patient and son agreeable. Contacted [name] with [facility], she will follow up tomorrow to as with arranging Home IV antibiotics.   Updated Dr. Gilford Raid EDP.    TOC will continue  to follow for discharge planning.
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
## 2021-09-05 NOTE — ED Triage Notes (Signed)
Patient here for evaluation of drains in right abdomen. Was discharged from [facility] to rehab facility in [place] which has now left 24 hours after admission there due to concern of not receiving tube feeding nor antibiotics through his PICC line in the right arm.

## 2021-09-05 NOTE — Progress Notes (Signed)
VAST consult received. Awaiting chest x-ray result for PICC tip placement verification. Fran Lowes, RN VAST

## 2021-09-06 ENCOUNTER — Encounter (HOSPITAL_COMMUNITY): Payer: Self-pay | Admitting: Internal Medicine

## 2021-09-06 DIAGNOSIS — M25571 Pain in right ankle and joints of right foot: Secondary | ICD-10-CM | POA: Diagnosis not present

## 2021-09-06 DIAGNOSIS — S83411D Sprain of medial collateral ligament of right knee, subsequent encounter: Secondary | ICD-10-CM

## 2021-09-06 DIAGNOSIS — E871 Hypo-osmolality and hyponatremia: Secondary | ICD-10-CM

## 2021-09-06 DIAGNOSIS — E43 Unspecified severe protein-calorie malnutrition: Secondary | ICD-10-CM | POA: Insufficient documentation

## 2021-09-06 DIAGNOSIS — M25561 Pain in right knee: Secondary | ICD-10-CM

## 2021-09-06 DIAGNOSIS — Z932 Ileostomy status: Secondary | ICD-10-CM

## 2021-09-06 DIAGNOSIS — B9620 Unspecified Escherichia coli [E. coli] as the cause of diseases classified elsewhere: Principal | ICD-10-CM

## 2021-09-06 DIAGNOSIS — E785 Hyperlipidemia, unspecified: Secondary | ICD-10-CM

## 2021-09-06 LAB — CBC WITH DIFFERENTIAL/PLATELET
Abs Immature Granulocytes: 0.06 10*3/uL (ref 0.00–0.07)
Basophils Absolute: 0.1 10*3/uL (ref 0.0–0.1)
Basophils Relative: 1 %
Eosinophils Absolute: 0.2 10*3/uL (ref 0.0–0.5)
Eosinophils Relative: 2 %
HCT: 35.2 % — ABNORMAL LOW (ref 39.0–52.0)
Hemoglobin: 11.4 g/dL — ABNORMAL LOW (ref 13.0–17.0)
Immature Granulocytes: 1 %
Lymphocytes Relative: 41 %
Lymphs Abs: 3.9 10*3/uL (ref 0.7–4.0)
MCH: 26.8 pg (ref 26.0–34.0)
MCHC: 32.4 g/dL (ref 30.0–36.0)
MCV: 82.8 fL (ref 80.0–100.0)
Monocytes Absolute: 0.8 10*3/uL (ref 0.1–1.0)
Monocytes Relative: 8 %
Neutro Abs: 4.5 10*3/uL (ref 1.7–7.7)
Neutrophils Relative %: 47 %
Platelets: 399 10*3/uL (ref 150–400)
RBC: 4.25 MIL/uL (ref 4.22–5.81)
RDW: 15.4 % (ref 11.5–15.5)
WBC: 9.4 10*3/uL (ref 4.0–10.5)
nRBC: 0 % (ref 0.0–0.2)

## 2021-09-06 LAB — COMPREHENSIVE METABOLIC PANEL
ALT: 15 U/L (ref 0–44)
AST: 16 U/L (ref 15–41)
Albumin: 2.5 g/dL — ABNORMAL LOW (ref 3.5–5.0)
Alkaline Phosphatase: 88 U/L (ref 38–126)
Anion gap: 7 (ref 5–15)
BUN: 16 mg/dL (ref 8–23)
CO2: 23 mmol/L (ref 22–32)
Calcium: 9.7 mg/dL (ref 8.9–10.3)
Chloride: 100 mmol/L (ref 98–111)
Creatinine, Ser: 0.47 mg/dL — ABNORMAL LOW (ref 0.61–1.24)
GFR, Estimated: >60 mL/min (ref >60)
Glucose, Bld: 118 mg/dL — ABNORMAL HIGH (ref 70–99)
Potassium: 4 mmol/L (ref 3.5–5.1)
Sodium: 130 mmol/L — ABNORMAL LOW (ref 135–145)
Total Bilirubin: 0.6 mg/dL (ref 0.3–1.2)
Total Protein: 6.5 g/dL (ref 6.5–8.1)

## 2021-09-06 LAB — HEMOGLOBIN A1C
Hgb A1c MFr Bld: 5.4 % (ref 4.8–5.6)
Mean Plasma Glucose: 108.28 mg/dL

## 2021-09-06 LAB — LIPID PANEL
Cholesterol: 136 mg/dL (ref 0–200)
HDL: 28 mg/dL — ABNORMAL LOW (ref >40)
LDL Cholesterol: 56 mg/dL (ref 0–99)
Total CHOL/HDL Ratio: 4.9 RATIO
Triglycerides: 259 mg/dL — ABNORMAL HIGH (ref <150)
VLDL: 52 mg/dL — ABNORMAL HIGH (ref 0–40)

## 2021-09-06 LAB — GLUCOSE, CAPILLARY
Glucose-Capillary: 121 mg/dL — ABNORMAL HIGH (ref 70–99)
Glucose-Capillary: 121 mg/dL — ABNORMAL HIGH (ref 70–99)
Glucose-Capillary: 106 mg/dL — ABNORMAL HIGH (ref 70–99)

## 2021-09-06 LAB — PHOSPHORUS: Phosphorus: 3.4 mg/dL (ref 2.5–4.6)

## 2021-09-06 LAB — MAGNESIUM: Magnesium: 1.5 mg/dL — ABNORMAL LOW (ref 1.7–2.4)

## 2021-09-06 MED ORDER — JEVITY 1.5 CAL/FIBER PO LIQD
237.0000 mL | Freq: Two times a day (BID) | ORAL | Status: DC
Start: 2021-09-07 — End: 2021-09-08
  Administered 2021-09-07 – 2021-09-08 (×4): 237 mL
  Filled 2021-09-06 (×4): qty 237

## 2021-09-06 MED ORDER — ACETAMINOPHEN 325 MG PO TABS
650.0000 mg | ORAL_TABLET | Freq: Four times a day (QID) | ORAL | Status: DC | PRN
  Administered 2021-09-06 – 2021-09-07 (×3): 650 mg
  Filled 2021-09-06 (×2): qty 2

## 2021-09-06 MED ORDER — CEFTRIAXONE IV (FOR PTA / DISCHARGE USE ONLY): 2.0000 g | INTRAVENOUS | 0 refills | Status: AC

## 2021-09-06 MED ORDER — OXYCODONE HCL 5 MG PO TABS
5.0000 mg | ORAL_TABLET | Freq: Four times a day (QID) | ORAL | Status: DC | PRN
  Administered 2021-09-06 (×2): 5 mg
  Filled 2021-09-06 (×2): qty 1

## 2021-09-06 MED ORDER — POLYETHYLENE GLYCOL 3350 17 G PO PACK: 17.0000 g | PACK | Freq: Every day | ORAL | Status: DC | PRN

## 2021-09-06 MED ORDER — METRONIDAZOLE 500 MG PO TABS
500.0000 mg | ORAL_TABLET | Freq: Three times a day (TID) | ORAL | Status: DC
Start: 2021-09-06 — End: 2021-09-08
  Administered 2021-09-06 – 2021-09-08 (×8): 500 mg via ORAL
  Filled 2021-09-06 (×8): qty 1

## 2021-09-06 MED ORDER — MELATONIN 5 MG PO TABS
5.0000 mg | ORAL_TABLET | Freq: Every evening | ORAL | Status: DC | PRN
  Administered 2021-09-06: 5 mg
  Filled 2021-09-06: qty 1

## 2021-09-06 NOTE — Assessment & Plan Note (Signed)
Mild asymptomatic, no management needed.
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
## 2021-09-06 NOTE — Assessment & Plan Note (Signed)
-   Outpatient follow up with Orthopedics

## 2021-09-06 NOTE — Care Management (Signed)
    Durable Medical Equipment  (From admission, onward)           Start     Ordered   09/06/21 1620  For home use only DME lightweight manual wheelchair with seat cushion  Once       Comments: Patient suffers from  [place] which impairs their ability to perform daily activities like grooming and toileting in the home.  A walker will not resolve  issue with performing activities of daily living. A wheelchair will allow patient to safely perform daily activities. Patient is not able to propel themselves in the home using a standard weight wheelchair due to general weakness. Patient can self propel in the lightweight wheelchair. Length of need 12 months . Accessories: elevating leg rests (ELRs), wheel locks, extensions and anti-tippers.   09/06/21 1620

## 2021-09-06 NOTE — Progress Notes (Signed)
Orthopedic Tech Progress Note Patient Details:  Curtis Cooper 02/07/1951 [id number]  Ortho Devices Type of Ortho Device: ASO Ortho Device/Splint Location: RLE Ortho Device/Splint Interventions: Ordered, Application, Adjustment   Post Interventions Patient Tolerated: Well Instructions Provided: Adjustment of device, Care of device  [name] 09/06/2021, 7:00 AM
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
## 2021-09-06 NOTE — Assessment & Plan Note (Signed)
-   Consult dietitian 

## 2021-09-06 NOTE — Progress Notes (Signed)
OT Cancellation Note  Patient Details Name: [name] MRN: [id number] DOB: Aug 11, 1950   Cancelled Treatment:    Reason Eval/Treat Not Completed: Other (comment) Pt currently with breakfast tray though eager to mobilize after eating. Will follow up.   [name] 09/06/2021, 7:42 AM
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
## 2021-09-06 NOTE — Consult Note (Addendum)
[facility] Nurse ostomy follow up Patient receiving care in [facility] 6N11. No family present. Patient up in chair. Stoma type/location: LUQ colostomy Stomal assessment/size: 1 1/4 inches, round, budded, no sutures present. Peristomal assessment: intact Treatment options for stomal/peristomal skin: barrier ring Output: pudding consistency brown  Ostomy pouching: 1pc. Kellie Simmering [phone] for pouch, Kellie Simmering [phone] for barrier ring Education provided: I did a complete pouching education session with the patient today. He explained his son has been performing his ostomy care while at home, and he doesn't remember anything they may have told him in [place]. Enrolled patient in [place] Start Discharge program: Yes, the patient told me he had received a box from [place] and that his son had been using the supplies from that box while he was at home. I am asking the [place] to order 6 more pouches and barrier rings and explained to the patient that if he goes home over the weekend he should take all of his ostomy supplies home.  He voiced his understanding. He also understands he needs to contact [place] and ask them to help him obtain the pouch we used today.  10:23 Addendum to note above:  I contacted [employer] and they did NOT have him enrolled.  They were able to take his information over the phone and will send out a starter kit with the products we used today. Val Riles, RN, MSN, CWOCN, CNS-BC, pager [phone]

## 2021-09-06 NOTE — Progress Notes (Addendum)
PHARMACY CONSULT NOTE FOR:  OUTPATIENT  PARENTERAL ANTIBIOTIC THERAPY (OPAT)  Indication: perforated gallbladder Regimen: Ceftriaxone 2g q24 End date: 09/11/2021  IV antibiotic discharge orders are pended. To discharging provider:  please sign these orders via discharge navigator,  Select New Orders & click on the button choice - Manage This Unsigned Work.     Thank you for allowing pharmacy to be a part of this patient's care.  Vicenta Dunning, PharmD  PGY1 Pharmacy Resident    I discussed / reviewed the pharmacy note by Dr. Kara Mead and I agree with the resident's findings and plans as documented.   Thank you for involving pharmacy in this patient's care.  Renold Genta, PharmD, BCPS Clinical Pharmacist Clinical phone for 09/06/2021 until 3p is [phone] 09/06/2021 3:36 PM

## 2021-09-06 NOTE — Plan of Care (Signed)
  Problem: Education: Goal: Ability to describe self-care measures that may prevent or decrease complications (Diabetes Survival Skills Education) will improve Outcome: Progressing Goal: Individualized Educational Video(s) Outcome: Progressing   Problem: Coping: Goal: Ability to adjust to condition or change in health will improve Outcome: Progressing   Problem: Fluid Volume: Goal: Ability to maintain a balanced intake and output will improve Outcome: Progressing   Problem: Health Behavior/Discharge Planning: Goal: Ability to identify and utilize available resources and services will improve Outcome: Progressing Goal: Ability to manage health-related needs will improve Outcome: Progressing   Problem: Metabolic: Goal: Ability to maintain appropriate glucose levels will improve Outcome: Progressing   Problem: Nutritional: Goal: Maintenance of adequate nutrition will improve Outcome: Progressing Goal: Progress toward achieving an optimal weight will improve Outcome: Progressing   Problem: Skin Integrity: Goal: Risk for impaired skin integrity will decrease Outcome: Progressing   Problem: Tissue Perfusion: Goal: Adequacy of tissue perfusion will improve Outcome: Progressing   Problem: Health Behavior/Discharge Planning: Goal: Ability to manage health-related needs will improve Outcome: Progressing
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
## 2021-09-06 NOTE — Discharge Instructions (Addendum)
High-Calorie, High-Protein Nutrition Therapy (2021) A high-calorie, high-protein diet has been recommended to you. Your registered dietitian nutritionist (RDN) may have recommended this diet because you are having difficulty eating enough calories throughout the day, you have lost weight, and/or you need to add protein to your diet. Sometimes you may not feel like eating, even if you know the importance of good nutrition. The recommendations in this handout can help you with the following: Regaining your strength and energy Keeping your body healthy Healing and recovering from surgery or illness and fighting infection Tips: Schedule Your Meals and Snacks Several small meals and snacks are often better tolerated and digested than large meals. Strategies Plan to eat 3 meals and 3 snacks daily. Experiment with timing meals to find out when you have a larger appetite. Appetite may be greatest in the morning after not eating all night so you may prefer to eat your larger meals and snacks in the morning and at lunch. Breakfast-type foods are often better tolerated so eat foods such as eggs, pancakes, waffles and cereal for any meal or snack. Carry snacks with you so you are prepared to eat every 2 to 3 hours. Determine what works best for you if your body's cues for feeling hungry or full are not working. Eat a small meal or snack even if you don't feel hungry. Set a timer to remind you when it is time to eat. Take a walk before you eat (with health care provider's approval). Light or moderate physical activity can help you maintain muscle and increase your appetite. Make Eating Enjoyable Taking steps to make the experience enjoyable may help to increase your interest in eating and improve your appetite. Strategies: Eat with others whenever possible. Include your favorite foods to make meals more enjoyable. Try new foods. Save your beverage for the end of the meal so that you have more room for  food before you get full. Add Calories to Your Meals and Snacks Try adding calorie-dense foods so that each bite provides more nutrition. Strategies Drink milk, chocolate milk, soy milk, or smoothies instead of low-calorie beverages such as diet drinks or water. Cook with milk or soy milk instead of water when making dishes such as hot cereal, cocoa, or pudding. Add jelly, jam, honey, butter or margarine to bread and crackers. Add jam or fruit to ice cream and as a topping over cake. Mix dried fruit, nuts, granola, honey, or dry cereal with yogurt or hot cereals. Enjoy snacks such as milkshakes, smoothies, pudding, ice cream, or custard. Blend a fruit smoothie of a banana, frozen berries, milk or soy milk, and 1 tablespoon nonfat powdered milk or protein powder. Add Protein to Your Meals and Snacks Choose at least one protein food at each meal and snack to increase your daily intake. Strategies Add  cup nonfat dry milk powder or protein powder to make a high-protein milk to drink or to use in recipes that call for milk. Vanilla or peppermint extract or unsweetened cocoa powder could help to boost the flavor. Add hard-cooked eggs, leftover meat, grated cheese, canned beans or tofu to noodles, rice, salads, sandwiches, soups, casseroles, pasta, tuna and other mixed dishes. Add powdered milk or protein powder to hot cereals, meatloaf, casseroles, scrambled eggs, sauces, cream soups, and shakes. Add beans and lentils to salads, soups, casseroles, and vegetable dishes. Eat cottage cheese or yogurt, especially Greek yogurt, with fruit as a snack or dessert. Eat peanut or other nut butters on crackers, bread, toast,   waffles, apples, bananas or celery sticks. Add it to milkshakes, smoothies, or desserts. Consider a ready-made protein shake. Your RDN will make recommendations. Add Fats to Your Meals and Snacks Try adding fats to your meals and snacks. Fat provides more calories in fewer bites than  carbohydrate or protein and adds flavors to your foods. Strategies Snack on nuts and seeds or add them to foods like salads, pasta, cereals, yogurt, and ice cream.  Saut or stir-fry vegetables, meats, chicken, fish or tofu in olive or canola oil.  Add olive oil, other vegetable oils, butter or margarine to soups, vegetables, potatoes, cooked cereal, rice, pasta, bread, crackers, pancakes, or waffles. Snack on olives or add to pasta, pizza, or salad. Add avocado or guacamole to your salads, sandwiches, and other entrees. Include fatty fish such as salmon in your weekly meal plan. For general food safety tips, especially for clients with immunocompromised conditions, ask your RDN for the Food Safety Nutrition Therapy handout. Small Meal and Snack Ideas These snacks and meals are recommended when you have to eat but aren't necessarily hungry.  They are good choices because they are high in protein and high in calories.  2 graham crackers 2 tablespoons peanut or other nut butter 1 cup milk 2 slices whole wheat toast topped with:  avocado, mashed Seasoning of your choice   cup Greek yogurt  cup fruit  cup granola 2 deviled egg halves 5 whole wheat crackers  1 cup cream of tomato soup  grilled cheese sandwich 1 toasted waffle topped with: 2 tablespoons peanut or nut butter 1 tablespoon jam  Trail mix made with:  cup nuts  cup dried fruit  cup cold cereal, any variety  cup oatmeal or cream of wheat cereal 1 tablespoon peanut or nut butter  cup diced fruit   High-Calorie, High-Protein Sample 1-Day Menu View Nutrient Info Breakfast 1 egg, scrambled 1 ounce cheddar cheese 1 English muffin, whole wheat 1 tablespoon margarine 1 tablespoon jam  cup orange juice, fortified with calcium and vitamin D  Morning Snack 1 tablespoon peanut butter 1 banana 1 cup 1% milk  Lunch Tuna salad sandwich made with: 2 slices bread, whole wheat 3 ounces tuna mixed with: 1 tablespoon  mayonnaise  cup pudding  Afternoon Snack  cup hummus  cup carrots 1 pita  Evening Meal Enchilada casserole made with: 2 corn tortillas 3 ounces ground beef, cooked  cup black beans, cooked  cup corn, cooked 1 ounce grated cheddar cheese  cup enchilada sauce  avocado, sliced, topping for enchilada 1 tablespoon sour cream, topping for enchilada Salad:  cup lettuce, shredded  cup tomatoes, chopped, for salad 1 tablespoon olive oil and vinegar dressing, for salad  Evening Snack  cup Greek yogurt  cup blueberries  cup granola    Colostomy Nutrition Therapy   This handout will provide some basic information about the effects of the colostomy surgery on digestion, absorption, and dietary changes for better results.  Colostomy surgery is when part of the large intestine (colon) is removed or bypassed. The remaining part of the functioning colon is brought through the abdominal wall, creating a stoma. It can be temporary or permanent depending on the surgery. After surgery, your bowel is swollen and you should avoid high-fiber foods. This is because they are harder to digest, and avoiding them will allow the bowel to heal and to avoid blockage of the colostomy. The eating plan begins with clear liquids and then should be advanced to a fiber-restricted diet before  leaving the hospital. If you have lactose intolerance, then use lactose-free milk products. Chew your food slowly and well to help reduce the risk of blockage of the ostomy. As you recover, you will start eating solid foods, beginning with foods that are low in fiber (see Recommended Foods). The fiber-restricted diet should contain less than 13 grams of fiber for the whole day and may be less than 8 grams fiber per day depending on your symptoms. Most patients begin to eat more normally 6 weeks after surgery.   Tips Take small bites of foods and chew thoroughly for better digestion and absorption of nutrients. Some foods  may cause blockages, especially if eaten in large amounts or not chewed well. Use caution when eating these foods, eat small amounts only, and chew them thoroughly, which will also help with better absorption of nutrients. You may find that your appetite is not as good as before surgery, so eating small amounts about every 2-4 hours is recommended. Keeping a regular schedule for meals and snacks can help reduce gas and result in better absorption of the nutrients from the foods. Eat meals and snacks at the same times each day. Eating the largest meal in the middle of the day may decrease stool output at night, making it easier for you to get a full night's rest. Avoid acidic, spicy, fried or greasy foods, as well as foods that are high in sugar (candy, cake, pies, cookies, and sugary drinks). All of these foods can cause diarrhea. Foods that can thicken stools include bananas, applesauce, rice, and pasta. Some foods may cause odors or increase gas production (see Foods Not Recommended list). To reduce gas, avoid chewing gum, drinking with straws, carbonated beverages, smoking or chewing tobacco, eating too fast, and skipping meals. Missing meals can cause the small intestine to be more active and increase gas and watery stools. There is a "lag time"--the time from eating a gas-producing food and actual release of gas is 6-8 hours for distal colostomy patients. Limit foods that may cause gas or odor and choose foods that may decrease odor. Have at least 8 to 10 cups of fluids per day. You will need to drink more during hot weather, when you have increased stools, and at other times when you lose extra fluid, such as when you exercise. Fluid equivalents are: 1 ounce is 30 milliliters; 1 cup is 8 ounces; 8 cups is 64 ounces, or 2 quarts, or 2 liters. It is important to watch for signs and symptoms of fluid-electrolyte imbalance, which include dry mouth, reduced urine output, dark concentrated urine, feelings  of dizziness upon standing, marked fatigue, and abdominal cramping. If these occur, seek prompt treatment. Ileostomy patients are more at risk for dehydration. Remember, high-potassium foods are needed more to offset the effects of diarrhea. Add foods containing fiber gradually to your diet, but avoid those that can cause blockages initially. When you do add those foods, eat only very small portions and chew your food very well. Keep a food journal of foods tried and how you feel after eating them. Only try one new food every 3 days. You will need to eat enough fiber and drink enough fluids to avoid constipation.  Use a chewable multivitamin with minerals (non-gummy) daily for best absorption. Use a chewable or liquid calcium supplement. Liquid has the best absorption.  Foods Recommended Some people are sensitive to some of the foods listed on the Recommended Foods chart. You may find that some of these  foods cause you to have gas, unpleasant odors, or diarrhea. If this is the case, you should stop eating the foods that bother you. After 2 to 3 weeks, you can try small amounts to see whether they still cause symptoms.   Most of the recommended foods are low in fiber because fiber can be hard for your body to digest while you are recovering. When you first start eating solid foods, it is especially important that you stick to low-fiber choices (cooking vegetables and fruits or preparing canned items and removing skins and peels reduce the amount of fiber contained in these foods). As you heal, you can try foods with more fiber, such as whole grain foods.  Food Group Recommended Foods Notes  Milk and milk products Evaporated milk* Skim, low-fat milk, lactose free Skim, low-fat milk* Powdered milk* Buttermilk* Soy milk, rice milk or almond milk Yogurt* Kefir Cheese* Aged cheeses (cheddar cheese and Swiss cheese are lower in lactose) Low-fat ice cream* or sherbet* If you have diarrhea, try  lactose-free milk and other lactose-free products. Lactose is a kind of sugar in milk that causes diarrhea in some people. Buttermilk, yogurt, and kefir can help keep your body from producing bad odors. Cheese can help thicken stools. It may be a good choice if you have diarrhea. Check the labels for calcium content of almond and rice milk for at least 30% calcium. These types of milk are not high in protein, so include other high-protein foods at meals and snacks to support healing. Foods marked with an asterisk (*) contain lactose.  Meats and other protein foods Any meat or poultry prepared without added fat Smooth nut butters (limit serving size to 2 tablespoons or less) Fish Eggs Lactose free cottage cheese or other lactose free cheese Dried beans and peas can cause gas or bad odors. They should be avoided. For some people, fish, seafood, and eggs can cause bad odors. Try them in small amounts and see how your body reacts. Seafood, especially mollusks (oysters, clams and mussels), may cause blockage behind the stoma, so they must be chewed well. Avoid eating meats with casings, such as sausage or bratwurst. Peanut butter can help thicken stools. Smooth peanut butter may be a good choice if you have diarrhea. Do not eat chunky spreads.  Grains Bread, bagels, rolls, crackers, pasta, and cereals made from white or refined flour White rice At first, you should only choose grain foods that are made with refined grains (white flour and white rice). Check labels for less than 2 grams fiber per serving. White rice and pasta can help thicken stools. They may be good choices if you have diarrhea. As you recover, you can try foods made with whole grains (like whole wheat, brown rice, or oats). Start with small amounts and see how you feel after eating them.  Vegetables Most well-cooked vegetables without seeds or skins Iceberg lettuce (< 1cup), begin with only a small amount shredded fine on sandwich and  increase amount gradually week by week) Strained vegetable juice Potatoes without skins Some vegetables are more likely than others to cause gas, odors, or diarrhea. Many people develop gas or odors when they eat onions, garlic, or leeks. Vegetables in the cabbage family are also likely to cause problems. Avoid cabbages, broccoli, brussel sprouts, and cauliflower. Asparagus may cause bad odors. Potatoes can help thicken stools. They may be a good choice if you have diarrhea.  Fruits Most fruit juices (without pulp) Peeled fruit Canned fruit Fresh fruit  without edible seeds Juices may cause diarrhea; diluting with water can sometimes reduce diarrhea. Try only one type of juice for several days and watch symptoms. Avoid prune juice and grape juice, as they are more likely to cause diarrhea. Fruit peels should be avoided because they are high in fiber. Bananas and applesauce can help thicken stools. They may be good choices if you have diarrhea.  Fats and oils Butter, cream, cream cheese, margarine, mayonnaise, and oils When possible, choose healthy oils and fats, such as canola and olive oils. Limit to 8 teaspoons daily, as this may improve food tolerance and symptoms of discomfort.  Beverages             Any, except alcoholic beverages, prune juice, and grape juice         Carbonated drinks (such as soda) can cause gas. If you try them, start with a small amount. Alcoholic drinks (especially beer) can cause bad odors.  Others Avoid sugar substitutes, sugar alcohols such as xylitol and sorbitol. Cranberry juice can help keep your body from producing bad odors.   Foods Not Recommended When you first start eating solid foods, avoid foods that are high in fiber, such as whole grains, dried beans, and most raw vegetables and fruits. While you heal, you should also avoid any foods that cause you to experience odor, gas, diarrhea, or obstruction.  Foods That May Cause Blockage: Apples,  unpeeled Dried fruit, raisins Relishes and olives  Bean sprouts Grapes Salad greens  Cabbage, raw Green peppers Seeds and nuts  Casing on sausage Mushrooms Spinach  Celery Nuts Tough, fibrous meats (for example, steak on grill)  Coconut Peas Vegetable and fruit skins  Coleslaw Pickles Whole grains  Corn Pineapple    Cucumbers Popcorn     Foods That May Cause Gas or Odor: Alcohol Cauliflower Grapes  Apples Cheeses, some types Green pepper  Asparagus Corn Melons  Bananas Cucumber Onions  Beer Dairy products Peanuts  Broccoli Dried beans and peas Prunes  Brussels sprouts Eggs Radishes  Cabbage Fatty foods Turnips  Carbonated beverages Fish     Foods That May Discolor Stool: Beets Asparagus Spinach  Foods with red dye Broccoli     Foods That May Help Relieve Gas and Odor: Buttermilk Yogurt with active cultures  Cranberry juice Parsley   Foods That May Cause Diarrhea (Looser or More Frequent Stool): Alcohol (including beer) Fruit: fresh, canned, or dried Prune juice or prunes  Apricots (and stone fruits) Fruit juice: apple, grape, orange Raw vegetables  Beans, baked or legumes Gum, sugar free Spicy foods  Bran High-fat foods Soup  Broccoli High-sugar foods Sugar-free substitutes  Brussels sprouts Licorice Sugar-free foods containing mannitol or sorbitol  Cabbage Milk and dairy foods Tomatoes  Caffeinated drinks (especially hot) Nuts or seeds Turnip greens/green leafy vegetables  Chocolate Peaches (stone fruit) Wine  Corn Peas Wheat/whole grains  Fried meats, fish, and poultry Plums (stone fruit)     Foods That May Help Thicken Stool: Applesauce Saltines Oatmeal (when acceptable to have fiber)  Bananas Tapioca Pasta (sauce may increase symptoms)  White rice, boiled Peanut butter, creamy White bread (not high in fiber)  Cheese Potatoes, no skin Barley (when acceptable to have fiber)  Marshmallows Pretzels Yogurt   Colostomy Sample 1-Day Menu View Nutrient Info Breakfast  Omelet made with 2 egg whites 2 tablespoons grated cheese, for omelet 8 ounces cranberry juice (no pulp) (0.4 g fiber)  Morning Snack 1 English muffin (1.5 g fiber) 1 teaspoon margarine,  for English muffin  Lunch 3 ounces pork loin, tender 1/2 cup white rice (0.5 g fiber) 1 teaspoon margarine, for french bread 1 slice [place] bread (0.5 g fiber) 1 small banana (3 g fiber) 1 cup lactose-free milk  Afternoon Snack 1 cup yogurt  Evening Meal 1 ounce (small handful) pretzels (0.5 g fiber) 2 cups iced tea 2 ounces [place] 1 ounce swiss cheese 2 slices white bread 1/2 cup applesauce (1.5 g fiber)  Evening Snack 2 whole graham crackers (1.5 g fiber) 1 tablespoon smooth peanut butter (1 g fiber) 1 cup soy milk or lactose-free milk   Copyright [employer].
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
## 2021-09-06 NOTE — Assessment & Plan Note (Signed)
And biloma with percutaneous GB drain - Continue Rocephin and Flagyl - Outpatient follow up with ID

## 2021-09-07 ENCOUNTER — Inpatient Hospital Stay (HOSPITAL_COMMUNITY): Payer: PPO

## 2021-09-07 DIAGNOSIS — S83411D Sprain of medial collateral ligament of right knee, subsequent encounter: Secondary | ICD-10-CM | POA: Diagnosis not present

## 2021-09-07 LAB — URINALYSIS, ROUTINE W REFLEX MICROSCOPIC
Bilirubin Urine: NEGATIVE
Glucose, UA: NEGATIVE mg/dL
Ketones, ur: NEGATIVE mg/dL
Nitrite: NEGATIVE
Protein, ur: NEGATIVE mg/dL
RBC / HPF: >50 RBC/hpf — ABNORMAL HIGH (ref 0–5)
Specific Gravity, Urine: 1.017 (ref 1.005–1.030)
pH: 5 (ref 5.0–8.0)

## 2021-09-07 LAB — GLUCOSE, CAPILLARY
Glucose-Capillary: 117 mg/dL — ABNORMAL HIGH (ref 70–99)
Glucose-Capillary: 168 mg/dL — ABNORMAL HIGH (ref 70–99)
Glucose-Capillary: 151 mg/dL — ABNORMAL HIGH (ref 70–99)
Glucose-Capillary: 122 mg/dL — ABNORMAL HIGH (ref 70–99)

## 2021-09-07 MED ORDER — JEVITY 1.5 CAL/FIBER PO LIQD: 237.0000 mL | Freq: Two times a day (BID) | ORAL | Status: DC

## 2021-09-07 MED ORDER — METRONIDAZOLE 500 MG PO TABS: 500.0000 mg | ORAL_TABLET | Freq: Three times a day (TID) | ORAL | 0 refills | Status: AC

## 2021-09-07 NOTE — Plan of Care (Signed)
  Problem: Pain Managment: Goal: General experience of comfort will improve Outcome: Progressing   

## 2021-09-07 NOTE — Care Management Important Message (Signed)
Important Message  Patient Details  Name: Curtis Cooper MRN: [id number] Date of Birth: [birth year]   Medicare Important Message Given:  Yes     Memory Argue 09/07/2021, 3:19 PM
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
## 2021-09-07 NOTE — Progress Notes (Signed)
Orthopedic Tech Progress Note Patient Details:  Curtis Cooper 28-Apr-1950 [id number]  Called in order to [employer] for a ROM KNEE BRACE UNLOCKED   Patient ID: Curtis Cooper, male   DOB: 09/23/1950, 71 y.o.   MRN: [id number]  Janit Pagan 09/07/2021, 11:36 AM
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
## 2021-09-08 DIAGNOSIS — B9620 Unspecified Escherichia coli [E. coli] as the cause of diseases classified elsewhere: Secondary | ICD-10-CM

## 2021-09-08 DIAGNOSIS — Z932 Ileostomy status: Secondary | ICD-10-CM | POA: Diagnosis not present

## 2021-09-08 DIAGNOSIS — E43 Unspecified severe protein-calorie malnutrition: Secondary | ICD-10-CM

## 2021-09-08 DIAGNOSIS — E785 Hyperlipidemia, unspecified: Secondary | ICD-10-CM

## 2021-09-08 DIAGNOSIS — R7881 Bacteremia: Secondary | ICD-10-CM | POA: Diagnosis not present

## 2021-09-08 DIAGNOSIS — S83411D Sprain of medial collateral ligament of right knee, subsequent encounter: Secondary | ICD-10-CM | POA: Diagnosis not present

## 2021-09-08 LAB — COMPREHENSIVE METABOLIC PANEL
ALT: 14 U/L (ref 0–44)
AST: 14 U/L — ABNORMAL LOW (ref 15–41)
Albumin: 2.3 g/dL — ABNORMAL LOW (ref 3.5–5.0)
Alkaline Phosphatase: 68 U/L (ref 38–126)
Anion gap: 8 (ref 5–15)
BUN: 9 mg/dL (ref 8–23)
CO2: 24 mmol/L (ref 22–32)
Calcium: 9.2 mg/dL (ref 8.9–10.3)
Chloride: 99 mmol/L (ref 98–111)
Creatinine, Ser: 0.63 mg/dL (ref 0.61–1.24)
GFR, Estimated: >60 mL/min (ref >60)
Glucose, Bld: 131 mg/dL — ABNORMAL HIGH (ref 70–99)
Potassium: 3.5 mmol/L (ref 3.5–5.1)
Sodium: 131 mmol/L — ABNORMAL LOW (ref 135–145)
Total Bilirubin: 0.5 mg/dL (ref 0.3–1.2)
Total Protein: 5.9 g/dL — ABNORMAL LOW (ref 6.5–8.1)

## 2021-09-08 LAB — CBC
HCT: 32.6 % — ABNORMAL LOW (ref 39.0–52.0)
Hemoglobin: 10.7 g/dL — ABNORMAL LOW (ref 13.0–17.0)
MCH: 27.1 pg (ref 26.0–34.0)
MCHC: 32.8 g/dL (ref 30.0–36.0)
MCV: 82.5 fL (ref 80.0–100.0)
Platelets: 361 10*3/uL (ref 150–400)
RBC: 3.95 MIL/uL — ABNORMAL LOW (ref 4.22–5.81)
RDW: 15.4 % (ref 11.5–15.5)
WBC: 8.6 10*3/uL (ref 4.0–10.5)
nRBC: 0 % (ref 0.0–0.2)

## 2021-09-08 LAB — GLUCOSE, CAPILLARY
Glucose-Capillary: 138 mg/dL — ABNORMAL HIGH (ref 70–99)
Glucose-Capillary: 211 mg/dL — ABNORMAL HIGH (ref 70–99)

## 2021-09-08 MED ORDER — SODIUM CHLORIDE 0.9% FLUSH: 10.0000 mL | INTRAVENOUS | 1 refills | Status: DC | PRN

## 2021-09-08 MED ORDER — HEPARIN SOD (PORK) LOCK FLUSH 100 UNIT/ML IV SOLN
250.0000 [IU] | INTRAVENOUS | Status: AC | PRN
  Administered 2021-09-08: 250 [IU]

## 2021-09-08 MED ORDER — ONDANSETRON HCL 4 MG PO TABS: 4.0000 mg | ORAL_TABLET | Freq: Every day | ORAL | 1 refills | Status: DC | PRN

## 2021-09-08 NOTE — Progress Notes (Signed)
Patient discharged to home with instructions and some supplies, demonstrated how to flush and empty drains.

## 2021-09-08 NOTE — Plan of Care (Signed)
With 1 episode of vomiting last night, still feeling nauseated.  Problem: Nutritional: Goal: Maintenance of adequate nutrition will improve Outcome: Progressing
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
## 2021-09-08 NOTE — TOC Transition Note (Addendum)
Transition of Care [facility]) - CM/SW Discharge Note   Patient Details  Name: Curtis Cooper MRN: [id number] Date of Birth: 05-26-1950  [facility]) CM/SW Contact:  Bartholomew Crews, RN Phone Number: [phone] 09/08/2021, 11:15 AM   Clinical Narrative:     Verified with [name] at [facility] that teaching for home infusion with patient's son had been completed. Medications were delivered to home yesterday and included dose for today - patient does not need to receive antibiotic dose in hospital today.   Spoke with patient on his cell phone to discuss post acute transition. Patient feels ready to transition home. Discussed no home health agency at this time. Advised f/u with PCP next week. Son to provide transportation home. Patient consented to [facility] calling son.   Spoke with patient's son on his cell phone. He verbalized understanding of home infusion. He confirmed package received - advised that today's dose was included in package, so he will need to administer today's dose once patient gets home. Son asked about chole tube drain - encouraged f/u with PCP or previous GI. Nursing to provide care instructions. Son to provide transportation home stating he will arrive about 1pm - nursing made aware.   No further TOC needs identified at this time.   UPDATE 1455: Referral to [facility] for [facility] declined d/t staffing. Referral to [place] declined d/t oon with [facility].   Final next level of care: [place] Barriers to Discharge: No [facility] [place] will accept this patient   Patient Goals and CMS Choice Patient states their goals for this hospitalization and ongoing recovery are:: return  home with son's assistance CMS Medicare.gov Compare Post Acute Care list provided to:: Patient Choice offered to / list presented to : Patient, Adult Children  Discharge Placement                       Discharge Plan and Services     Post Acute Care Choice: Home Health          DME  Arranged: Wheelchair manual DME Agency: [employer] Date DME Agency Contacted: 09/06/21 Time DME Agency Contacted: [phone] Representative spoke with at DME Agency: [place]: RN, PT, OT [facility] Agency: [place] ([place]) Date [place]: 09/08/21 Time [place]: 1115 Representative spoke with at [place]: currently no accepting home health agency  Social Determinants of Health (SDOH) Interventions     Readmission Risk Interventions     No data to display

## 2021-09-10 ENCOUNTER — Other Ambulatory Visit: Payer: Self-pay

## 2021-09-10 ENCOUNTER — Ambulatory Visit
Admission: RE | Admit: 2021-09-10 | Discharge: 2021-09-10 | Disposition: A | Discharge status: Home / Self Care | Payer: PPO | Source: Ambulatory Visit | Attending: Family Medicine | Admitting: Family Medicine

## 2021-09-10 ENCOUNTER — Emergency Department: Admission: EM | Admit: 2021-09-10 | Payer: PPO | Source: Home / Self Care

## 2021-09-10 DIAGNOSIS — R7881 Bacteremia: Secondary | ICD-10-CM | POA: Insufficient documentation

## 2021-09-10 DIAGNOSIS — T82898A Other specified complication of vascular prosthetic devices, implants and grafts, initial encounter: Secondary | ICD-10-CM | POA: Diagnosis not present

## 2021-09-10 DIAGNOSIS — Z452 Encounter for adjustment and management of vascular access device: Secondary | ICD-10-CM | POA: Diagnosis not present

## 2021-09-10 DIAGNOSIS — A498 Other bacterial infections of unspecified site: Secondary | ICD-10-CM | POA: Diagnosis not present

## 2021-09-10 DIAGNOSIS — M25561 Pain in right knee: Secondary | ICD-10-CM | POA: Diagnosis not present

## 2021-09-10 MED ORDER — HEPARIN SOD (PORK) LOCK FLUSH 100 UNIT/ML IV SOLN
250.0000 [IU] | INTRAVENOUS | Status: AC | PRN
  Administered 2021-09-10: 250 [IU]

## 2021-09-10 NOTE — ED Provider Triage Note (Signed)
Emergency Medicine Provider Triage Evaluation Note  MARQUAIL BRADWELL , a 71 y.o. male  was evaluated in triage.  Pt complains of pic line is not working. Problems with it last pm.  No other complaints.  Review of Systems  Positive:  Negative:   Physical Exam  Ht '5\' 9"'$  (1.753 m)   Wt 74.8 kg   BMI 24.37 kg/m  Gen:   Awake, no distress   Resp:  Normal effort  MSK:   Moves extremities without difficulty  Other:    Medical Decision Making  Medically screening exam initiated at 1:25 PM.  Appropriate orders placed.  Kathleene Hazel was informed that the remainder of the evaluation will be completed by another provider, this initial triage assessment does not replace that evaluation, and the importance of remaining in the ED until their evaluation is complete.     Johnn Hai, PA-C 09/10/21 1337
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
## 2021-09-10 NOTE — ED Triage Notes (Signed)
Pt presents for PICC line check/flush.  Sts it stopped working last night.  Pt has no medical complains.

## 2021-09-11 LAB — CULTURE, BLOOD (ROUTINE X 2)
Culture: NO GROWTH
Culture: NO GROWTH

## 2021-12-24 ENCOUNTER — Other Ambulatory Visit: Payer: Self-pay | Admitting: Family Medicine

## 2021-12-24 DIAGNOSIS — N62 Hypertrophy of breast: Secondary | ICD-10-CM

## 2022-01-11 ENCOUNTER — Ambulatory Visit
Admission: RE | Admit: 2022-01-11 | Discharge: 2022-01-11 | Disposition: A | Discharge status: Home / Self Care | Payer: Medicare Other | Source: Ambulatory Visit | Attending: Family Medicine | Admitting: Family Medicine

## 2022-01-11 DIAGNOSIS — N62 Hypertrophy of breast: Secondary | ICD-10-CM | POA: Diagnosis not present

## 2022-01-28 ENCOUNTER — Other Ambulatory Visit: Payer: Self-pay | Admitting: Family Medicine

## 2022-01-28 DIAGNOSIS — E782 Mixed hyperlipidemia: Secondary | ICD-10-CM

## 2022-01-28 DIAGNOSIS — Z9189 Other specified personal risk factors, not elsewhere classified: Secondary | ICD-10-CM

## 2022-02-12 ENCOUNTER — Other Ambulatory Visit: Payer: Self-pay | Admitting: Family Medicine

## 2022-02-12 DIAGNOSIS — E782 Mixed hyperlipidemia: Secondary | ICD-10-CM

## 2022-02-12 DIAGNOSIS — Z9189 Other specified personal risk factors, not elsewhere classified: Secondary | ICD-10-CM

## 2022-02-14 ENCOUNTER — Ambulatory Visit
Admission: RE | Admit: 2022-02-14 | Discharge: 2022-02-14 | Disposition: A | Discharge status: Home / Self Care | Payer: No Typology Code available for payment source | Source: Ambulatory Visit | Attending: Family Medicine | Admitting: Family Medicine

## 2022-02-14 DIAGNOSIS — E782 Mixed hyperlipidemia: Secondary | ICD-10-CM

## 2022-02-14 DIAGNOSIS — Z9189 Other specified personal risk factors, not elsewhere classified: Secondary | ICD-10-CM

## 2022-03-12 ENCOUNTER — Ambulatory Visit
Admission: RE | Admit: 2022-03-12 | Discharge: 2022-03-12 | Disposition: A | Discharge status: Home / Self Care | Payer: Self-pay | Source: Ambulatory Visit | Attending: "Endocrinology | Admitting: "Endocrinology

## 2022-03-12 ENCOUNTER — Other Ambulatory Visit: Payer: Self-pay | Admitting: "Endocrinology

## 2022-03-12 ENCOUNTER — Other Ambulatory Visit: Payer: Self-pay | Admitting: Physician Assistant

## 2022-03-12 DIAGNOSIS — R590 Localized enlarged lymph nodes: Secondary | ICD-10-CM

## 2022-03-14 ENCOUNTER — Other Ambulatory Visit: Payer: Self-pay | Admitting: "Endocrinology

## 2022-03-14 ENCOUNTER — Other Ambulatory Visit (HOSPITAL_BASED_OUTPATIENT_CLINIC_OR_DEPARTMENT_OTHER): Payer: Self-pay | Admitting: "Endocrinology

## 2022-03-14 DIAGNOSIS — R590 Localized enlarged lymph nodes: Secondary | ICD-10-CM

## 2022-03-27 ENCOUNTER — Other Ambulatory Visit: Payer: Self-pay | Admitting: Physician Assistant

## 2022-03-27 DIAGNOSIS — R590 Localized enlarged lymph nodes: Secondary | ICD-10-CM

## 2022-05-23 ENCOUNTER — Ambulatory Visit
Admission: RE | Admit: 2022-05-23 | Discharge: 2022-05-23 | Disposition: A | Discharge status: Home / Self Care | Payer: Medicare Other | Source: Ambulatory Visit | Attending: Physician Assistant | Admitting: Physician Assistant

## 2022-05-23 DIAGNOSIS — R590 Localized enlarged lymph nodes: Secondary | ICD-10-CM | POA: Diagnosis present

## 2022-05-23 MED ORDER — IOHEXOL 300 MG/ML  SOLN
75.0000 mL | Freq: Once | INTRAMUSCULAR | Status: AC | PRN
  Administered 2022-05-23: 75 mL via INTRAVENOUS

## 2022-05-28 ENCOUNTER — Inpatient Hospital Stay: Payer: Medicare Other | Attending: Oncology | Admitting: Oncology

## 2022-05-28 ENCOUNTER — Inpatient Hospital Stay: Payer: Medicare Other

## 2022-05-28 ENCOUNTER — Telehealth: Payer: Self-pay

## 2022-05-28 ENCOUNTER — Encounter: Payer: Self-pay | Admitting: Oncology

## 2022-05-28 VITALS — BP 163/73 | HR 72 | Temp 96.7°F | Resp 18 | Wt 163.9 lb

## 2022-05-28 DIAGNOSIS — R590 Localized enlarged lymph nodes: Secondary | ICD-10-CM

## 2022-05-28 DIAGNOSIS — Z87891 Personal history of nicotine dependence: Secondary | ICD-10-CM | POA: Insufficient documentation

## 2022-05-28 DIAGNOSIS — Z79899 Other long term (current) drug therapy: Secondary | ICD-10-CM | POA: Insufficient documentation

## 2022-05-28 DIAGNOSIS — R9389 Abnormal findings on diagnostic imaging of other specified body structures: Secondary | ICD-10-CM

## 2022-05-28 DIAGNOSIS — D649 Anemia, unspecified: Secondary | ICD-10-CM | POA: Insufficient documentation

## 2022-05-28 DIAGNOSIS — Z832 Family history of diseases of the blood and blood-forming organs and certain disorders involving the immune mechanism: Secondary | ICD-10-CM | POA: Diagnosis not present

## 2022-05-28 DIAGNOSIS — Z87442 Personal history of urinary calculi: Secondary | ICD-10-CM | POA: Insufficient documentation

## 2022-05-28 DIAGNOSIS — C099 Malignant neoplasm of tonsil, unspecified: Secondary | ICD-10-CM | POA: Insufficient documentation

## 2022-05-28 DIAGNOSIS — Z7189 Other specified counseling: Secondary | ICD-10-CM | POA: Insufficient documentation

## 2022-05-28 DIAGNOSIS — R221 Localized swelling, mass and lump, neck: Secondary | ICD-10-CM

## 2022-05-28 LAB — CBC WITH DIFFERENTIAL/PLATELET
Abs Immature Granulocytes: 0.01 10*3/uL (ref 0.00–0.07)
Basophils Absolute: 0 10*3/uL (ref 0.0–0.1)
Basophils Relative: 0 %
Eosinophils Absolute: 0.1 10*3/uL (ref 0.0–0.5)
Eosinophils Relative: 2 %
HCT: 38.8 % — ABNORMAL LOW (ref 39.0–52.0)
Hemoglobin: 12.3 g/dL — ABNORMAL LOW (ref 13.0–17.0)
Immature Granulocytes: 0 %
Lymphocytes Relative: 44 %
Lymphs Abs: 2.9 10*3/uL (ref 0.7–4.0)
MCH: 26.5 pg (ref 26.0–34.0)
MCHC: 31.7 g/dL (ref 30.0–36.0)
MCV: 83.6 fL (ref 80.0–100.0)
Monocytes Absolute: 0.5 10*3/uL (ref 0.1–1.0)
Monocytes Relative: 7 %
Neutro Abs: 3.1 10*3/uL (ref 1.7–7.7)
Neutrophils Relative %: 47 %
Platelets: 314 10*3/uL (ref 150–400)
RBC: 4.64 MIL/uL (ref 4.22–5.81)
RDW: 13.3 % (ref 11.5–15.5)
WBC: 6.5 10*3/uL (ref 4.0–10.5)
nRBC: 0 % (ref 0.0–0.2)

## 2022-05-28 LAB — COMPREHENSIVE METABOLIC PANEL
ALT: 20 U/L (ref 0–44)
AST: 25 U/L (ref 15–41)
Albumin: 4.1 g/dL (ref 3.5–5.0)
Alkaline Phosphatase: 70 U/L (ref 38–126)
Anion gap: 8 (ref 5–15)
BUN: 9 mg/dL (ref 8–23)
CO2: 24 mmol/L (ref 22–32)
Calcium: 9.4 mg/dL (ref 8.9–10.3)
Chloride: 108 mmol/L (ref 98–111)
Creatinine, Ser: 1.11 mg/dL (ref 0.61–1.24)
GFR, Estimated: >60 mL/min (ref >60)
Glucose, Bld: 101 mg/dL — ABNORMAL HIGH (ref 70–99)
Potassium: 3.5 mmol/L (ref 3.5–5.1)
Sodium: 140 mmol/L (ref 135–145)
Total Bilirubin: 0.3 mg/dL (ref 0.3–1.2)
Total Protein: 7.2 g/dL (ref 6.5–8.1)

## 2022-05-28 LAB — LACTATE DEHYDROGENASE: LDH: 122 U/L (ref 98–192)

## 2022-05-28 NOTE — Telephone Encounter (Signed)
Per LOS on 4/16:   US guided neck mass biopsy   Follow up 1 week after US biopsy

## 2022-05-28 NOTE — Assessment & Plan Note (Signed)
Discussed with patient
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
## 2022-05-28 NOTE — Assessment & Plan Note (Addendum)
Palpable right cervical lymphadenopathy,  Suspect malignancy, differential includes head and neck cancer with cervical lymph node metastasis, lymphoma, metastasis from other solid organ malignancy. Recommend ultrasound-guided lymph node biopsy to establish tissue diagnosis.  Referred to establish care with ENT Recommend PET scan for staging Check labs today, CBC, CMP, LDH, flow cytometry.

## 2022-05-28 NOTE — Assessment & Plan Note (Signed)
Chronic, stable.  Continue monitor.

## 2022-05-30 LAB — COMP PANEL: LEUKEMIA/LYMPHOMA

## 2022-05-30 NOTE — Telephone Encounter (Signed)
Pt scheduled for Bm bx tomorrow (4/19 ) and PET on 4/29. Please schedule MD only 2-3 days after PET and inform pt of appt.

## 2022-05-30 NOTE — Progress Notes (Signed)
Pernell Dupre, MD sent to Curtis Cooper Approved for [place] core needle biopsy of right neck LN.  Elijio Miles       Previous Messages    ----- Message ----- From: Curtis Cooper Sent: 05/29/2022  10:25 AM EDT To: Pernell Dupre, MD Subject: ATTN: Dr Juliette Alcide   US Biopsy                    IR Approval Request:   Procedure:   US guided core RT neck mass/LN biopsy  Reason:       RT neck mass / enlarged RT Lymph nodes  History:        CT soft tissue neck w/contrast  05/23/2022  Provider:      Dr [name], MD  Provider Contact #    [facility]   [phone]   -----PT will be having a PET scan on 06/10/2022

## 2022-05-31 ENCOUNTER — Ambulatory Visit
Admission: RE | Admit: 2022-05-31 | Discharge: 2022-05-31 | Disposition: A | Discharge status: Home / Self Care | Payer: Medicare Other | Source: Ambulatory Visit | Attending: Oncology | Admitting: Oncology

## 2022-05-31 DIAGNOSIS — R221 Localized swelling, mass and lump, neck: Secondary | ICD-10-CM

## 2022-05-31 DIAGNOSIS — C770 Secondary and unspecified malignant neoplasm of lymph nodes of head, face and neck: Secondary | ICD-10-CM | POA: Insufficient documentation

## 2022-05-31 MED ORDER — LIDOCAINE HCL (PF) 1 % IJ SOLN
10.0000 mL | Freq: Once | INTRAMUSCULAR | Status: AC
  Administered 2022-05-31: 10 mL via INTRADERMAL

## 2022-05-31 NOTE — Procedures (Signed)
Interventional Radiology Procedure:   Indications: Palplable right neck mass with right cervical lymphadenopathy   Procedure: US guided core biopsy of right neck mass  Findings: Heterogenous right neck mass.  4 core biopsies obtained.    Complications: None     EBL: Minimal  Plan: Discharge to home.   Curtis Cooper R. Lowella Dandy, MD  Pager: [phone]

## 2022-06-05 ENCOUNTER — Encounter: Payer: Self-pay | Admitting: Oncology

## 2022-06-07 LAB — SURGICAL PATHOLOGY

## 2022-06-10 ENCOUNTER — Ambulatory Visit
Admission: RE | Admit: 2022-06-10 | Discharge: 2022-06-10 | Disposition: A | Discharge status: Home / Self Care | Payer: Medicare Other | Source: Ambulatory Visit | Attending: Oncology | Admitting: Oncology

## 2022-06-10 DIAGNOSIS — R590 Localized enlarged lymph nodes: Secondary | ICD-10-CM | POA: Insufficient documentation

## 2022-06-10 DIAGNOSIS — N62 Hypertrophy of breast: Secondary | ICD-10-CM | POA: Diagnosis not present

## 2022-06-10 DIAGNOSIS — I700 Atherosclerosis of aorta: Secondary | ICD-10-CM | POA: Diagnosis not present

## 2022-06-10 DIAGNOSIS — R9389 Abnormal findings on diagnostic imaging of other specified body structures: Secondary | ICD-10-CM | POA: Diagnosis not present

## 2022-06-10 DIAGNOSIS — R221 Localized swelling, mass and lump, neck: Secondary | ICD-10-CM | POA: Diagnosis not present

## 2022-06-10 DIAGNOSIS — C760 Malignant neoplasm of head, face and neck: Secondary | ICD-10-CM | POA: Insufficient documentation

## 2022-06-10 LAB — GLUCOSE, CAPILLARY: Glucose-Capillary: 96 mg/dL (ref 70–99)

## 2022-06-10 MED ORDER — FLUDEOXYGLUCOSE F - 18 (FDG) INJECTION
8.4000 | Freq: Once | INTRAVENOUS | Status: AC | PRN
  Administered 2022-06-10: 8.71 via INTRAVENOUS

## 2022-06-13 ENCOUNTER — Inpatient Hospital Stay (HOSPITAL_BASED_OUTPATIENT_CLINIC_OR_DEPARTMENT_OTHER): Payer: Medicare Other | Admitting: Oncology

## 2022-06-13 ENCOUNTER — Encounter: Payer: Self-pay | Admitting: Oncology

## 2022-06-13 ENCOUNTER — Telehealth (INDEPENDENT_AMBULATORY_CARE_PROVIDER_SITE_OTHER): Payer: Self-pay

## 2022-06-13 VITALS — BP 143/72 | HR 70 | Temp 97.9°F | Resp 18 | Wt 168.8 lb

## 2022-06-13 DIAGNOSIS — Z8619 Personal history of other infectious and parasitic diseases: Secondary | ICD-10-CM | POA: Insufficient documentation

## 2022-06-13 DIAGNOSIS — Z87442 Personal history of urinary calculi: Secondary | ICD-10-CM | POA: Insufficient documentation

## 2022-06-13 DIAGNOSIS — Z806 Family history of leukemia: Secondary | ICD-10-CM | POA: Insufficient documentation

## 2022-06-13 DIAGNOSIS — Z7952 Long term (current) use of systemic steroids: Secondary | ICD-10-CM | POA: Insufficient documentation

## 2022-06-13 DIAGNOSIS — I700 Atherosclerosis of aorta: Secondary | ICD-10-CM | POA: Insufficient documentation

## 2022-06-13 DIAGNOSIS — Z87891 Personal history of nicotine dependence: Secondary | ICD-10-CM | POA: Insufficient documentation

## 2022-06-13 DIAGNOSIS — Z79899 Other long term (current) drug therapy: Secondary | ICD-10-CM | POA: Insufficient documentation

## 2022-06-13 DIAGNOSIS — Z7189 Other specified counseling: Secondary | ICD-10-CM

## 2022-06-13 DIAGNOSIS — Z5111 Encounter for antineoplastic chemotherapy: Secondary | ICD-10-CM | POA: Insufficient documentation

## 2022-06-13 DIAGNOSIS — D649 Anemia, unspecified: Secondary | ICD-10-CM | POA: Insufficient documentation

## 2022-06-13 DIAGNOSIS — N644 Mastodynia: Secondary | ICD-10-CM | POA: Insufficient documentation

## 2022-06-13 DIAGNOSIS — N62 Hypertrophy of breast: Secondary | ICD-10-CM

## 2022-06-13 DIAGNOSIS — C099 Malignant neoplasm of tonsil, unspecified: Secondary | ICD-10-CM | POA: Insufficient documentation

## 2022-06-13 DIAGNOSIS — R197 Diarrhea, unspecified: Secondary | ICD-10-CM | POA: Insufficient documentation

## 2022-06-13 DIAGNOSIS — Z510 Encounter for antineoplastic radiation therapy: Secondary | ICD-10-CM | POA: Diagnosis present

## 2022-06-13 MED ORDER — DEXAMETHASONE 4 MG PO TABS
ORAL_TABLET | ORAL | 1 refills | Status: DC
Start: 2022-06-13 — End: 2022-10-16

## 2022-06-13 MED ORDER — PROCHLORPERAZINE MALEATE 10 MG PO TABS: 10.0000 mg | ORAL_TABLET | Freq: Four times a day (QID) | ORAL | 1 refills | Status: DC | PRN

## 2022-06-13 MED ORDER — ONDANSETRON HCL 8 MG PO TABS
8.00 mg | ORAL_TABLET | Freq: Three times a day (TID) | ORAL | 1 refills | Status: DC | PRN
Start: 2022-06-13 — End: 2022-08-16

## 2022-06-13 MED ORDER — LIDOCAINE-PRILOCAINE 2.5-2.5 % EX CREA
TOPICAL_CREAM | CUTANEOUS | 3 refills | Status: DC
Start: 2022-06-13 — End: 2023-04-09

## 2022-06-13 NOTE — Assessment & Plan Note (Signed)
Biopsy and PET scan results were reviewed and discussed with patient. 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  Pending ENT evaluation. P16 positive. Recommend concurrent chemotherapy cisplatin and radiation I explained to the patient the risks and benefits of chemotherapy cisplatin including all but not limited to infusion reaction, hair loss, hearing loss, mouth sore, nausea, vomiting, low blood counts, bleeding, kidney failure and risk of life threatening infection and even death, secondary malignancy etc.  Patient voices understanding and agrees with chemotherapy.  # Recommend baseline hearing testing.  Recommend chemotherapy education; patient reports that he does not have good vein access.  Referred to vascular surgery for Medi port placement. Antiemetics-Zofran and Compazine; EMLA cream sent to pharmacy Supportive care measures are necessary for patient well-being and will be provided as necessary. We spent sufficient time to discuss many aspect of care, questions were answered to patient's satisfaction.
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
## 2022-06-13 NOTE — Assessment & Plan Note (Signed)
Discussed with patient. Curative intent.  

## 2022-06-13 NOTE — Assessment & Plan Note (Signed)
Recommend endocrinology evaluation.  Patient reports that he has been seen by Dr. Gershon Crane before.  Recommend patient to continue follow-up
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
## 2022-06-13 NOTE — Telephone Encounter (Signed)
Spoke with the patient and he is scheduled with Dr. Wyn Quaker for a port placement on 06/17/22 with a 9:30 am arrival time to the [facility]. Pre-procedure instructions were discussed and will be mailed.

## 2022-06-13 NOTE — Progress Notes (Signed)
START ON PATHWAY REGIMEN - Head and Neck     A cycle is every 7 days:     Cisplatin   **Always confirm dose/schedule in your pharmacy ordering system**  Patient Characteristics: Oropharynx, HPV Positive, Preoperative or Nonsurgical Candidate (Clinical Staging), cT0-4, cN1-3 or cT3-4, cN0 Disease Classification: Oropharynx HPV Status: Positive (+) Therapeutic Status: Preoperative or Nonsurgical Candidate (Clinical Staging) AJCC T Category: cT2 AJCC 8 Stage Grouping: I AJCC N Category: cN1 AJCC M Category: cM0 Intent of Therapy: Curative Intent, Discussed with Patient 

## 2022-06-13 NOTE — Progress Notes (Signed)
Pt here for follow up. Pt reports that he still has nodules on breast and it has not gotten better. Pt has appt with END ton 5/16

## 2022-06-14 ENCOUNTER — Other Ambulatory Visit: Payer: Self-pay

## 2022-06-17 ENCOUNTER — Encounter
Admission: RE | Disposition: A | Discharge status: Home / Self Care | Payer: Self-pay | Source: Home / Self Care | Attending: Vascular Surgery

## 2022-06-17 ENCOUNTER — Other Ambulatory Visit: Payer: Self-pay

## 2022-06-17 ENCOUNTER — Encounter: Payer: Self-pay | Admitting: Vascular Surgery

## 2022-06-17 ENCOUNTER — Ambulatory Visit
Admission: RE | Admit: 2022-06-17 | Discharge: 2022-06-17 | Disposition: A | Discharge status: Home / Self Care | Payer: Medicare Other | Attending: Vascular Surgery | Admitting: Vascular Surgery

## 2022-06-17 ENCOUNTER — Other Ambulatory Visit: Payer: Medicare Other

## 2022-06-17 DIAGNOSIS — Z87891 Personal history of nicotine dependence: Secondary | ICD-10-CM | POA: Diagnosis not present

## 2022-06-17 DIAGNOSIS — C099 Malignant neoplasm of tonsil, unspecified: Secondary | ICD-10-CM | POA: Insufficient documentation

## 2022-06-17 DIAGNOSIS — N62 Hypertrophy of breast: Secondary | ICD-10-CM | POA: Diagnosis not present

## 2022-06-17 HISTORY — PX: PORTA CATH INSERTION: CATH118285

## 2022-06-17 SURGERY — PORTA CATH INSERTION
Anesthesia: Moderate Sedation

## 2022-06-17 MED ORDER — ONDANSETRON HCL 4 MG/2ML IJ SOLN: 4.0000 mg | Freq: Four times a day (QID) | INTRAMUSCULAR | Status: DC | PRN

## 2022-06-17 MED ORDER — FENTANYL CITRATE (PF) 100 MCG/2ML IJ SOLN
INTRAMUSCULAR | Status: AC
  Filled 2022-06-17: qty 2

## 2022-06-17 MED ORDER — METHYLPREDNISOLONE SODIUM SUCC 125 MG IJ SOLR: 125.0000 mg | Freq: Once | INTRAMUSCULAR | Status: DC | PRN

## 2022-06-17 MED ORDER — CEFAZOLIN SODIUM-DEXTROSE 2-4 GM/100ML-% IV SOLN
2.0000 g | INTRAVENOUS | Status: AC
  Administered 2022-06-17: 2 g via INTRAVENOUS

## 2022-06-17 MED ORDER — FAMOTIDINE 20 MG PO TABS: 40.0000 mg | ORAL_TABLET | Freq: Once | ORAL | Status: DC | PRN

## 2022-06-17 MED ORDER — MIDAZOLAM HCL 2 MG/2ML IJ SOLN
INTRAMUSCULAR | Status: DC | PRN
  Administered 2022-06-17: 2 mg via INTRAVENOUS

## 2022-06-17 MED ORDER — MIDAZOLAM HCL 2 MG/2ML IJ SOLN
INTRAMUSCULAR | Status: AC
  Filled 2022-06-17: qty 2

## 2022-06-17 MED ORDER — SODIUM CHLORIDE 0.9 % IV SOLN
80.0000 mg | Freq: Once | INTRAVENOUS | Status: DC
  Filled 2022-06-17: qty 2

## 2022-06-17 MED ORDER — FENTANYL CITRATE (PF) 100 MCG/2ML IJ SOLN
INTRAMUSCULAR | Status: DC | PRN
  Administered 2022-06-17: 50 ug via INTRAVENOUS

## 2022-06-17 MED ORDER — SODIUM CHLORIDE 0.9 % IV SOLN: INTRAVENOUS | Status: DC

## 2022-06-17 MED ORDER — DIPHENHYDRAMINE HCL 50 MG/ML IJ SOLN: 50.0000 mg | Freq: Once | INTRAMUSCULAR | Status: DC | PRN

## 2022-06-17 MED ORDER — CEFAZOLIN SODIUM-DEXTROSE 2-4 GM/100ML-% IV SOLN
INTRAVENOUS | Status: AC
  Filled 2022-06-17: qty 100

## 2022-06-17 MED ORDER — MIDAZOLAM HCL 2 MG/ML PO SYRP: 8.0000 mg | ORAL_SOLUTION | Freq: Once | ORAL | Status: DC | PRN

## 2022-06-17 MED ORDER — HYDROMORPHONE HCL 1 MG/ML IJ SOLN: 1.0000 mg | Freq: Once | INTRAMUSCULAR | Status: DC | PRN

## 2022-06-17 SURGICAL SUPPLY — 9 items
ADH SKN CLS APL DERMABOND .7 (GAUZE/BANDAGES/DRESSINGS) ×1
DERMABOND ADVANCED .7 DNX12 (GAUZE/BANDAGES/DRESSINGS) IMPLANT
KIT PORT POWER 8FR ALT SEPTUM (Port) IMPLANT
PACK ANGIOGRAPHY (CUSTOM PROCEDURE TRAY) ×1 IMPLANT
PENCIL ELECTRO HAND CTR (MISCELLANEOUS) IMPLANT
SUT MNCRL AB 4-0 PS2 18 (SUTURE) IMPLANT
SUT VIC AB 3-0 SH 27 (SUTURE) ×1
SUT VIC AB 3-0 SH 27X BRD (SUTURE) IMPLANT
TUBING CONNECTING 10 (TUBING) IMPLANT

## 2022-06-17 NOTE — Interval H&P Note (Signed)
History and Physical Interval Note:  06/17/2022 9:18 AM  Curtis Cooper  has presented today for surgery, with the diagnosis of Porta Cath Placement    Tonsillar Ca.  The various methods of treatment have been discussed with the patient and family. After consideration of risks, benefits and other options for treatment, the patient has consented to  Procedure(s): PORTA CATH INSERTION (N/A) as a surgical intervention.  The patient's history has been reviewed, patient examined, no change in status, stable for surgery.  I have reviewed the patient's chart and labs.  Questions were answered to the patient's satisfaction.     Festus Barren

## 2022-06-17 NOTE — Op Note (Signed)
      [facility]       Operative Note  Date: 06/17/2022  Preoperative diagnosis:  1. Tonsillar cancer  Postoperative diagnosis:  Same as above  Procedures: #1. Ultrasound guidance for vascular access to the right internal jugular vein. #2. Fluoroscopic guidance for placement of catheter. #3. Placement of CT compatible Port-A-Cath, right internal jugular vein.  Surgeon: Festus Barren, MD.   Anesthesia: Local with moderate conscious sedation for approximately 22  minutes using 2 mg of Versed and 50 mcg of Fentanyl  Fluoroscopy time: less than 1 minute  Contrast used: 0  Estimated blood loss: 5 cc  Indication for the procedure:  The patient is a 72 y.o.male with tonsillar cancer.  The patient needs a Port-A-Cath for durable venous access, chemotherapy, lab draws, and CT scans. We are asked to place this. Risks and benefits were discussed and informed consent was obtained.  Description of procedure: The patient was brought to the vascular and interventional radiology suite.  Moderate conscious sedation was administered throughout the procedure during a face to face encounter with the patient with my supervision of the RN administering medicines and monitoring the patient's vital signs, pulse oximetry, telemetry and mental status throughout from the start of the procedure until the patient was taken to the recovery room. The right neck chest and shoulder were sterilely prepped and draped, and a sterile surgical field was created. Ultrasound was used to help visualize a patent right internal jugular vein. This was then accessed under direct ultrasound guidance without difficulty with the Seldinger needle and a permanent image was recorded. A J-wire was placed. After skin nick and dilatation, the peel-away sheath was then placed over the wire. I then anesthetized an area under the clavicle approximately 1-2 fingerbreadths. A transverse incision was created and an inferior pocket  was created with electrocautery and blunt dissection. The port was then brought onto the field, placed into the pocket and secured to the chest wall with 2 Prolene sutures. The catheter was connected to the port and tunneled from the subclavicular incision to the access site. Fluoroscopic guidance was then used to cut the catheter to an appropriate length. The catheter was then placed through the peel-away sheath and the peel-away sheath was removed. The catheter tip was parked in excellent location under fluorocoscopic guidance in the cavoatrial junction. The pocket was then irrigated with antibiotic impregnated saline and the wound was closed with a running 3-0 Vicryl and a 4-0 Monocryl. The access incision was closed with a single 4-0 Monocryl. The Huber needle was used to withdraw blood and flush the port with heparinized saline. Dermabond was then placed as a dressing. The patient tolerated the procedure well and was taken to the recovery room in stable condition.   Festus Barren 06/17/2022 11:25 AM   This note was created with Dragon Medical transcription system. Any errors in dictation are purely unintentional.

## 2022-06-18 ENCOUNTER — Encounter: Payer: Self-pay | Admitting: Vascular Surgery

## 2022-06-19 ENCOUNTER — Other Ambulatory Visit: Payer: Self-pay

## 2022-06-19 ENCOUNTER — Encounter: Payer: Self-pay | Admitting: Vascular Surgery

## 2022-06-20 ENCOUNTER — Telehealth: Payer: Self-pay | Admitting: *Deleted

## 2022-06-20 ENCOUNTER — Other Ambulatory Visit: Payer: Self-pay | Admitting: Oncology

## 2022-06-20 ENCOUNTER — Ambulatory Visit
Admission: RE | Admit: 2022-06-20 | Discharge: 2022-06-20 | Disposition: A | Discharge status: Home / Self Care | Payer: Medicare Other | Source: Ambulatory Visit | Attending: Radiation Oncology | Admitting: Radiation Oncology

## 2022-06-20 ENCOUNTER — Inpatient Hospital Stay: Payer: Medicare Other

## 2022-06-20 VITALS — BP 137/76 | HR 82 | Temp 95.6°F | Resp 18 | Ht 70.0 in | Wt 167.0 lb

## 2022-06-20 DIAGNOSIS — C099 Malignant neoplasm of tonsil, unspecified: Secondary | ICD-10-CM

## 2022-06-20 DIAGNOSIS — Z510 Encounter for antineoplastic radiation therapy: Secondary | ICD-10-CM | POA: Diagnosis not present
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
## 2022-06-20 NOTE — Telephone Encounter (Signed)
Kenney Houseman called asking if Dr Cathie Hoops has decided anything more about treatment start and that he wants it to be on a Friday when it is scheduled

## 2022-06-20 NOTE — Telephone Encounter (Signed)
He will start around same time as he starts radiation tx. Dr. Cathie Hoops has entered tx plan in [facility] for 5/24.  Please contact Tanya with appts once scheduled.

## 2022-06-21 ENCOUNTER — Other Ambulatory Visit: Payer: Self-pay

## 2022-06-21 ENCOUNTER — Encounter: Payer: Self-pay | Admitting: Oncology

## 2022-06-25 ENCOUNTER — Ambulatory Visit
Admission: RE | Admit: 2022-06-25 | Discharge: 2022-06-25 | Disposition: A | Discharge status: Home / Self Care | Payer: Medicare Other | Source: Ambulatory Visit | Attending: Radiation Oncology | Admitting: Radiation Oncology

## 2022-06-25 ENCOUNTER — Other Ambulatory Visit: Payer: Self-pay

## 2022-06-25 DIAGNOSIS — Z510 Encounter for antineoplastic radiation therapy: Secondary | ICD-10-CM | POA: Diagnosis not present

## 2022-06-26 ENCOUNTER — Inpatient Hospital Stay: Payer: Medicare Other

## 2022-06-26 ENCOUNTER — Encounter: Payer: Self-pay | Admitting: Oncology

## 2022-06-26 ENCOUNTER — Inpatient Hospital Stay (HOSPITAL_BASED_OUTPATIENT_CLINIC_OR_DEPARTMENT_OTHER): Payer: Medicare Other | Admitting: Hospice and Palliative Medicine

## 2022-06-26 DIAGNOSIS — C099 Malignant neoplasm of tonsil, unspecified: Secondary | ICD-10-CM
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
## 2022-06-26 NOTE — Progress Notes (Signed)
Multidisciplinary Oncology Council Documentation  Curtis Cooper was presented by our [facility] on 06/26/2022, which included representatives from:  Palliative Care Dietitian  Physical/Occupational Therapist Nurse Navigator Genetics Speech Therapist Social work Survivorship RN Financial Navigator Research RN   Curtis Cooper currently presents with history of tonsillar cancer  We reviewed previous medical and familial history, history of present illness, and recent lab results along with all available histopathologic and imaging studies. The MOC considered available treatment options and made the following recommendations/referrals:  Nutrition, SLP  The MOC is a meeting of clinicians from various specialty areas who evaluate and discuss patients for whom a multidisciplinary approach is being considered. Final determinations in the plan of care are those of the provider(s).   Today's extended care, comprehensive team conference, Curtis Cooper was not present for the discussion and was not examined.

## 2022-06-28 ENCOUNTER — Ambulatory Visit: Payer: Medicare Other | Attending: Hospice and Palliative Medicine | Admitting: Speech Pathology

## 2022-06-28 DIAGNOSIS — R1312 Dysphagia, oropharyngeal phase: Secondary | ICD-10-CM | POA: Insufficient documentation

## 2022-06-28 DIAGNOSIS — C099 Malignant neoplasm of tonsil, unspecified: Secondary | ICD-10-CM | POA: Insufficient documentation
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
## 2022-07-02 DIAGNOSIS — Z510 Encounter for antineoplastic radiation therapy: Secondary | ICD-10-CM | POA: Diagnosis not present

## 2022-07-03 ENCOUNTER — Ambulatory Visit
Admission: RE | Admit: 2022-07-03 | Discharge: 2022-07-03 | Disposition: A | Discharge status: Home / Self Care | Payer: Medicare Other | Source: Ambulatory Visit | Attending: Radiation Oncology | Admitting: Radiation Oncology

## 2022-07-04 ENCOUNTER — Ambulatory Visit
Admission: RE | Admit: 2022-07-04 | Discharge: 2022-07-04 | Disposition: A | Discharge status: Home / Self Care | Payer: Medicare Other | Source: Ambulatory Visit | Attending: Radiation Oncology | Admitting: Radiation Oncology

## 2022-07-04 ENCOUNTER — Inpatient Hospital Stay: Payer: Medicare Other

## 2022-07-04 ENCOUNTER — Inpatient Hospital Stay (HOSPITAL_BASED_OUTPATIENT_CLINIC_OR_DEPARTMENT_OTHER): Payer: Medicare Other | Admitting: Oncology

## 2022-07-04 ENCOUNTER — Encounter: Payer: Self-pay | Admitting: Oncology

## 2022-07-04 ENCOUNTER — Other Ambulatory Visit: Payer: Self-pay

## 2022-07-04 VITALS — BP 158/74 | HR 74 | Temp 97.7°F | Resp 18 | Wt 169.6 lb

## 2022-07-04 DIAGNOSIS — D649 Anemia, unspecified: Secondary | ICD-10-CM

## 2022-07-04 DIAGNOSIS — Z5111 Encounter for antineoplastic chemotherapy: Secondary | ICD-10-CM | POA: Diagnosis not present

## 2022-07-04 DIAGNOSIS — Z510 Encounter for antineoplastic radiation therapy: Secondary | ICD-10-CM | POA: Diagnosis not present

## 2022-07-04 DIAGNOSIS — Z95828 Presence of other vascular implants and grafts: Secondary | ICD-10-CM

## 2022-07-04 DIAGNOSIS — C099 Malignant neoplasm of tonsil, unspecified: Secondary | ICD-10-CM

## 2022-07-04 LAB — CMP (CANCER CENTER ONLY)
ALT: 13 U/L (ref 0–44)
AST: 23 U/L (ref 15–41)
Albumin: 3.7 g/dL (ref 3.5–5.0)
Alkaline Phosphatase: 69 U/L (ref 38–126)
Anion gap: 6 (ref 5–15)
BUN: 14 mg/dL (ref 8–23)
CO2: 27 mmol/L (ref 22–32)
Calcium: 9.5 mg/dL (ref 8.9–10.3)
Chloride: 107 mmol/L (ref 98–111)
Creatinine: 0.99 mg/dL (ref 0.61–1.24)
GFR, Estimated: >60 mL/min (ref >60)
Glucose, Bld: 168 mg/dL — ABNORMAL HIGH (ref 70–99)
Potassium: 3.8 mmol/L (ref 3.5–5.1)
Sodium: 140 mmol/L (ref 135–145)
Total Bilirubin: 0.4 mg/dL (ref 0.3–1.2)
Total Protein: 6.8 g/dL (ref 6.5–8.1)

## 2022-07-04 LAB — RAD ONC ARIA SESSION SUMMARY
Course Elapsed Days: 0
Plan Fractions Treated to Date: 1
Plan Prescribed Dose Per Fraction: 2 Gy
Plan Total Fractions Prescribed: 35
Plan Total Prescribed Dose: 70 Gy
Reference Point Dosage Given to Date: 2 Gy
Reference Point Session Dosage Given: 2 Gy
Session Number: 1

## 2022-07-04 LAB — CBC WITH DIFFERENTIAL (CANCER CENTER ONLY)
Abs Immature Granulocytes: 0.02 10*3/uL (ref 0.00–0.07)
Basophils Absolute: 0 10*3/uL (ref 0.0–0.1)
Basophils Relative: 0 %
Eosinophils Absolute: 0.2 10*3/uL (ref 0.0–0.5)
Eosinophils Relative: 2 %
HCT: 39.8 % (ref 39.0–52.0)
Hemoglobin: 12.6 g/dL — ABNORMAL LOW (ref 13.0–17.0)
Immature Granulocytes: 0 %
Lymphocytes Relative: 43 %
Lymphs Abs: 3.2 10*3/uL (ref 0.7–4.0)
MCH: 25.8 pg — ABNORMAL LOW (ref 26.0–34.0)
MCHC: 31.7 g/dL (ref 30.0–36.0)
MCV: 81.6 fL (ref 80.0–100.0)
Monocytes Absolute: 0.5 10*3/uL (ref 0.1–1.0)
Monocytes Relative: 7 %
Neutro Abs: 3.5 10*3/uL (ref 1.7–7.7)
Neutrophils Relative %: 48 %
Platelet Count: 275 10*3/uL (ref 150–400)
RBC: 4.88 MIL/uL (ref 4.22–5.81)
RDW: 14.3 % (ref 11.5–15.5)
WBC Count: 7.5 10*3/uL (ref 4.0–10.5)
nRBC: 0 % (ref 0.0–0.2)

## 2022-07-04 LAB — MAGNESIUM: Magnesium: 1.9 mg/dL (ref 1.7–2.4)

## 2022-07-04 MED ORDER — HEPARIN SOD (PORK) LOCK FLUSH 100 UNIT/ML IV SOLN
500.0000 [IU] | Freq: Once | INTRAVENOUS | Status: AC
  Administered 2022-07-04: 500 [IU] via INTRAVENOUS
  Filled 2022-07-04: qty 5

## 2022-07-04 MED ORDER — SODIUM CHLORIDE 0.9% FLUSH
10.0000 mL | Freq: Once | INTRAVENOUS | Status: AC
  Administered 2022-07-04: 10 mL via INTRAVENOUS
  Filled 2022-07-04: qty 10

## 2022-07-04 MED FILL — Dexamethasone Sodium Phosphate Inj 100 MG/10ML: INTRAMUSCULAR | Qty: 1 | Status: AC

## 2022-07-04 MED FILL — Fosaprepitant Dimeglumine For IV Infusion 150 MG (Base Eq): INTRAVENOUS | Qty: 5 | Status: AC
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
## 2022-07-04 NOTE — Assessment & Plan Note (Signed)
Chemotherapy plan as listed above 

## 2022-07-04 NOTE — Assessment & Plan Note (Addendum)
Biopsy and PET scan results were reviewed and discussed with patient. 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  Pending ENT evaluation. P16 positive. Recommend concurrent chemotherapy cisplatin and radiation Labs are reviewed and discussed with patient. Proceed with cisplatin 40mg /m2  we discussed about antiemetic medication instructions. Follow-up with radiation oncology for radiation.

## 2022-07-04 NOTE — Assessment & Plan Note (Signed)
Chronic, stable.  Continue monitor. 

## 2022-07-05 ENCOUNTER — Inpatient Hospital Stay: Payer: Medicare Other

## 2022-07-05 ENCOUNTER — Ambulatory Visit
Admission: RE | Admit: 2022-07-05 | Discharge: 2022-07-05 | Disposition: A | Discharge status: Home / Self Care | Payer: Medicare Other | Source: Ambulatory Visit | Attending: Radiation Oncology | Admitting: Radiation Oncology

## 2022-07-05 ENCOUNTER — Other Ambulatory Visit: Payer: Self-pay

## 2022-07-05 VITALS — BP 144/82 | HR 66 | Temp 96.9°F | Resp 18

## 2022-07-05 DIAGNOSIS — C099 Malignant neoplasm of tonsil, unspecified: Secondary | ICD-10-CM

## 2022-07-05 DIAGNOSIS — Z510 Encounter for antineoplastic radiation therapy: Secondary | ICD-10-CM | POA: Diagnosis not present

## 2022-07-05 LAB — RAD ONC ARIA SESSION SUMMARY
Course Elapsed Days: 1
Plan Fractions Treated to Date: 2
Plan Prescribed Dose Per Fraction: 2 Gy
Plan Total Fractions Prescribed: 35
Plan Total Prescribed Dose: 70 Gy
Reference Point Dosage Given to Date: 4 Gy
Reference Point Session Dosage Given: 2 Gy
Session Number: 2

## 2022-07-05 MED ORDER — MAGNESIUM SULFATE 2 GM/50ML IV SOLN
2.0000 g | Freq: Once | INTRAVENOUS | Status: AC
  Administered 2022-07-05: 2 g via INTRAVENOUS
  Filled 2022-07-05: qty 50

## 2022-07-05 MED ORDER — PALONOSETRON HCL INJECTION 0.25 MG/5ML
0.2500 mg | Freq: Once | INTRAVENOUS | Status: AC
  Administered 2022-07-05: 0.25 mg via INTRAVENOUS
  Filled 2022-07-05: qty 5

## 2022-07-05 MED ORDER — SODIUM CHLORIDE 0.9% FLUSH
10.0000 mL | INTRAVENOUS | Status: DC | PRN
  Administered 2022-07-05: 10 mL
  Filled 2022-07-05: qty 10

## 2022-07-05 MED ORDER — SODIUM CHLORIDE 0.9 % IV SOLN
Freq: Once | INTRAVENOUS | Status: AC
  Filled 2022-07-05: qty 250

## 2022-07-05 MED ORDER — HEPARIN SOD (PORK) LOCK FLUSH 100 UNIT/ML IV SOLN
500.0000 [IU] | Freq: Once | INTRAVENOUS | Status: AC | PRN
  Administered 2022-07-05: 500 [IU]
  Filled 2022-07-05: qty 5

## 2022-07-05 MED ORDER — SODIUM CHLORIDE 0.9 % IV SOLN
10.0000 mg | Freq: Once | INTRAVENOUS | Status: AC
  Administered 2022-07-05: 10 mg via INTRAVENOUS
  Filled 2022-07-05: qty 10

## 2022-07-05 MED ORDER — SODIUM CHLORIDE 0.9 % IV SOLN
40.0000 mg/m2 | Freq: Once | INTRAVENOUS | Status: AC
  Administered 2022-07-05: 78 mg via INTRAVENOUS
  Filled 2022-07-05: qty 78

## 2022-07-05 MED ORDER — SODIUM CHLORIDE 0.9 % IV SOLN
150.0000 mg | Freq: Once | INTRAVENOUS | Status: AC
  Administered 2022-07-05: 150 mg via INTRAVENOUS
  Filled 2022-07-05: qty 150

## 2022-07-05 MED ORDER — POTASSIUM CHLORIDE IN NACL 20-0.9 MEQ/L-% IV SOLN
Freq: Once | INTRAVENOUS | Status: AC
  Filled 2022-07-05: qty 1000

## 2022-07-05 NOTE — Patient Instructions (Signed)
[place] [facility]  Discharge Instructions: Thank you for choosing [facility] to provide your oncology and hematology care.  If you have a lab appointment with the Cancer Center, please go directly to the Cancer Center and check in at the registration area.  Wear comfortable clothing and clothing appropriate for easy access to any Portacath or PICC line.   We strive to give you quality time with your provider. You may need to reschedule your appointment if you arrive late (15 or more minutes).  Arriving late affects you and other patients whose appointments are after yours.  Also, if you miss three or more appointments without notifying the office, you may be dismissed from the clinic at the provider's discretion.      For prescription refill requests, have your pharmacy contact our office and allow 72 hours for refills to be completed.    Today you received the following chemotherapy and/or immunotherapy agents CISPLATIN      To help prevent nausea and vomiting after your treatment, we encourage you to take your nausea medication as directed.  BELOW ARE SYMPTOMS THAT SHOULD BE REPORTED IMMEDIATELY: *FEVER GREATER THAN 100.4 F (38 C) OR HIGHER *CHILLS OR SWEATING *NAUSEA AND VOMITING THAT IS NOT CONTROLLED WITH YOUR NAUSEA MEDICATION *UNUSUAL SHORTNESS OF BREATH *UNUSUAL BRUISING OR BLEEDING *URINARY PROBLEMS (pain or burning when urinating, or frequent urination) *BOWEL PROBLEMS (unusual diarrhea, constipation, pain near the anus) TENDERNESS IN MOUTH AND THROAT WITH OR WITHOUT PRESENCE OF ULCERS (sore throat, sores in mouth, or a toothache) UNUSUAL RASH, SWELLING OR PAIN  UNUSUAL VAGINAL DISCHARGE OR ITCHING   Items with * indicate a potential emergency and should be followed up as soon as possible or go to the Emergency Department if any problems should occur.  Please show the CHEMOTHERAPY ALERT CARD or IMMUNOTHERAPY ALERT CARD at check-in to  the Emergency Department and triage nurse.  Should you have questions after your visit or need to cancel or reschedule your appointment, please contact [place] [facility]  [phone] and follow the prompts.  Office hours are 8:00 a.m. to 4:30 p.m. Monday - Friday. Please note that voicemails left after 4:00 p.m. may not be returned until the following business day.  We are closed weekends and major holidays. You have access to a nurse at all times for urgent questions. Please call the main number to the clinic [phone] and follow the prompts.  For any non-urgent questions, you may also contact your provider using MyChart. We now offer e-Visits for anyone 34 and older to request care online for non-urgent symptoms. For details visit [email or web address].   Also download the MyChart app! Go to the app store, search "MyChart", open the app, select , and log in with your MyChart username and password.   Cisplatin Injection What is this medication? CISPLATIN (SIS pla tin) treats some types of cancer. It works by slowing down the growth of cancer cells. This medicine may be used for other purposes; ask your health care provider or pharmacist if you have questions. COMMON BRAND NAME(S): Platinol, Platinol -AQ What should I tell my care team before I take this medication? They need to know if you have any of these conditions: Eye disease, vision problems Hearing problems Kidney disease Low blood counts, such as low white cells, platelets, or red blood cells Tingling of the fingers or toes, or other nerve disorder An unusual or allergic reaction to cisplatin, carboplatin, oxaliplatin,  other medications, foods, dyes, or preservatives If you or your partner are pregnant or trying to get pregnant Breast-feeding How should I use this medication? This medication is injected into a vein. It is given by your care team in a hospital or clinic setting. Talk to  your care team about the use of this medication in children. Special care may be needed. Overdosage: If you think you have taken too much of this medicine contact a poison control center or emergency room at once. NOTE: This medicine is only for you. Do not share this medicine with others. What if I miss a dose? Keep appointments for follow-up doses. It is important not to miss your dose. Call your care team if you are unable to keep an appointment. What may interact with this medication? Do not take this medication with any of the following: Live virus vaccines This medication may also interact with the following: Certain antibiotics, such as amikacin, gentamicin, neomycin, polymyxin B, streptomycin, tobramycin, vancomycin Foscarnet This list may not describe all possible interactions. Give your health care provider a list of all the medicines, herbs, non-prescription drugs, or dietary supplements you use. Also tell them if you smoke, drink alcohol, or use illegal drugs. Some items may interact with your medicine. What should I watch for while using this medication? Your condition will be monitored carefully while you are receiving this medication. You may need blood work done while taking this medication. This medication may make you feel generally unwell. This is not uncommon, as chemotherapy can affect healthy cells as well as cancer cells. Report any side effects. Continue your course of treatment even though you feel ill unless your care team tells you to stop. This medication may increase your risk of getting an infection. Call your care team for advice if you get a fever, chills, sore throat, or other symptoms of a cold or flu. Do not treat yourself. Try to avoid being around people who are sick. Avoid taking medications that contain aspirin, acetaminophen, ibuprofen, naproxen, or ketoprofen unless instructed by your care team. These medications may hide a fever. This medication may increase  your risk to bruise or bleed. Call your care team if you notice any unusual bleeding. Be careful brushing or flossing your teeth or using a toothpick because you may get an infection or bleed more easily. If you have any dental work done, tell your dentist you are receiving this medication. Drink fluids as directed while you are taking this medication. This will help protect your kidneys. Call your care team if you get diarrhea. Do not treat yourself. Talk to your care team if you or your partner wish to become pregnant or think you might be pregnant. This medication can cause serious birth defects if taken during pregnancy and for 14 months after the last dose. A negative pregnancy test is required before starting this medication. A reliable form of contraception is recommended while taking this medication and for 14 months after the last dose. Talk to your care team about effective forms of contraception. Do not father a child while taking this medication and for 11 months after the last dose. Use a condom during sex during this time period. Do not breast-feed while taking this medication. This medication may cause infertility. Talk to your care team if you are concerned about your fertility. What side effects may I notice from receiving this medication? Side effects that you should report to your care team as soon as possible: Allergic reactions--skin  rash, itching, hives, swelling of the face, lips, tongue, or throat Eye pain, change in vision, vision loss Hearing loss, ringing in ears Infection--fever, chills, cough, sore throat, wounds that don't heal, pain or trouble when passing urine, general feeling of discomfort or being unwell Kidney injury--decrease in the amount of urine, swelling of the ankles, hands, or feet Low red blood cell level--unusual weakness or fatigue, dizziness, headache, trouble breathing Painful swelling, warmth, or redness of the skin, blisters or sores at the infusion  site Pain, tingling, or numbness in the hands or feet Unusual bruising or bleeding Side effects that usually do not require medical attention (report to your care team if they continue or are bothersome): Hair loss Nausea Vomiting This list may not describe all possible side effects. Call your doctor for medical advice about side effects. You may report side effects to FDA at 1-800-FDA-1088. Where should I keep my medication? This medication is given in a hospital or clinic. It will not be stored at home. NOTE: This sheet is a summary. It may not cover all possible information. If you have questions about this medicine, talk to your doctor, pharmacist, or health care provider.  2024 Elsevier/Gold Standard (2021-06-01 00:00:00)

## 2022-07-05 NOTE — Progress Notes (Signed)
Nutrition Follow-up:  Patient with stage IV right tonsil cancer, p 16+.  Starting chemotherapy today and radiation started yesterday.    Met with patient and niece Archie Patten during infusion.  Patient continues with good appetite.  Has been eating ice cream, brownies, meat, vegetables, macaroni and cheese, beans.    Medications: reviewed  Labs: reviewed  Anthropometrics:   Weight 169 lb 9.6 oz on 5/23 170 lb on 5/15  Has been gaining weight   NUTRITION DIAGNOSIS: Predicted sub optimal energy intake continues    INTERVENTION:  Continue high calorie, high protein foods to maintain weight     MONITORING, EVALUATION, GOAL:  Weight trends, intake   NEXT VISIT: Friday, May 31 during infusion  Tayleigh Wetherell B. Freida Busman, RD, LDN Registered Dietitian [phone]

## 2022-07-09 ENCOUNTER — Other Ambulatory Visit: Payer: Self-pay

## 2022-07-09 ENCOUNTER — Telehealth: Payer: Self-pay

## 2022-07-09 ENCOUNTER — Ambulatory Visit
Admission: RE | Admit: 2022-07-09 | Discharge: 2022-07-09 | Disposition: A | Discharge status: Home / Self Care | Payer: Medicare Other | Source: Ambulatory Visit | Attending: Radiation Oncology | Admitting: Radiation Oncology

## 2022-07-09 DIAGNOSIS — Z510 Encounter for antineoplastic radiation therapy: Secondary | ICD-10-CM | POA: Diagnosis not present

## 2022-07-09 LAB — RAD ONC ARIA SESSION SUMMARY
Course Elapsed Days: 5
Plan Fractions Treated to Date: 3
Plan Prescribed Dose Per Fraction: 2 Gy
Plan Total Fractions Prescribed: 35
Plan Total Prescribed Dose: 70 Gy
Reference Point Dosage Given to Date: 6 Gy
Reference Point Session Dosage Given: 2 Gy
Session Number: 3

## 2022-07-09 NOTE — Telephone Encounter (Signed)
Telephone call to patient for follow up after receiving first infusion on Friday.  No answer and states "Voice mailbox that has not been set up yet".   Unable to leave message.

## 2022-07-10 ENCOUNTER — Other Ambulatory Visit: Payer: Self-pay

## 2022-07-10 ENCOUNTER — Ambulatory Visit
Admission: RE | Admit: 2022-07-10 | Discharge: 2022-07-10 | Disposition: A | Discharge status: Home / Self Care | Payer: Medicare Other | Source: Ambulatory Visit | Attending: Radiation Oncology | Admitting: Radiation Oncology

## 2022-07-10 DIAGNOSIS — Z510 Encounter for antineoplastic radiation therapy: Secondary | ICD-10-CM | POA: Diagnosis not present

## 2022-07-10 LAB — RAD ONC ARIA SESSION SUMMARY
Course Elapsed Days: 6
Plan Fractions Treated to Date: 4
Plan Prescribed Dose Per Fraction: 2 Gy
Plan Total Fractions Prescribed: 35
Plan Total Prescribed Dose: 70 Gy
Reference Point Dosage Given to Date: 8 Gy
Reference Point Session Dosage Given: 2 Gy
Session Number: 4

## 2022-07-11 ENCOUNTER — Other Ambulatory Visit: Payer: Self-pay

## 2022-07-11 ENCOUNTER — Ambulatory Visit: Payer: Medicare Other | Admitting: Oncology

## 2022-07-11 ENCOUNTER — Other Ambulatory Visit: Payer: Medicare Other

## 2022-07-11 ENCOUNTER — Ambulatory Visit
Admission: RE | Admit: 2022-07-11 | Discharge: 2022-07-11 | Disposition: A | Discharge status: Home / Self Care | Payer: Medicare Other | Source: Ambulatory Visit | Attending: Radiation Oncology | Admitting: Radiation Oncology

## 2022-07-11 DIAGNOSIS — Z510 Encounter for antineoplastic radiation therapy: Secondary | ICD-10-CM | POA: Diagnosis not present

## 2022-07-11 LAB — RAD ONC ARIA SESSION SUMMARY
Course Elapsed Days: 7
Plan Fractions Treated to Date: 5
Plan Prescribed Dose Per Fraction: 2 Gy
Plan Total Fractions Prescribed: 35
Plan Total Prescribed Dose: 70 Gy
Reference Point Dosage Given to Date: 10 Gy
Reference Point Session Dosage Given: 2 Gy
Session Number: 5

## 2022-07-11 MED FILL — Fosaprepitant Dimeglumine For IV Infusion 150 MG (Base Eq): INTRAVENOUS | Qty: 5 | Status: AC

## 2022-07-11 MED FILL — Dexamethasone Sodium Phosphate Inj 100 MG/10ML: INTRAMUSCULAR | Qty: 1 | Status: AC

## 2022-07-12 ENCOUNTER — Inpatient Hospital Stay (HOSPITAL_BASED_OUTPATIENT_CLINIC_OR_DEPARTMENT_OTHER): Payer: Medicare Other | Admitting: Oncology

## 2022-07-12 ENCOUNTER — Ambulatory Visit: Payer: Medicare Other

## 2022-07-12 ENCOUNTER — Inpatient Hospital Stay: Payer: Medicare Other

## 2022-07-12 ENCOUNTER — Ambulatory Visit
Admission: RE | Admit: 2022-07-12 | Discharge: 2022-07-12 | Disposition: A | Discharge status: Home / Self Care | Payer: Medicare Other | Source: Ambulatory Visit | Attending: Radiation Oncology | Admitting: Radiation Oncology

## 2022-07-12 ENCOUNTER — Encounter: Payer: Self-pay | Admitting: Oncology

## 2022-07-12 ENCOUNTER — Other Ambulatory Visit: Payer: Self-pay

## 2022-07-12 VITALS — BP 145/80 | HR 71 | Temp 97.1°F | Resp 18 | Wt 159.4 lb

## 2022-07-12 DIAGNOSIS — G620 Drug-induced polyneuropathy: Secondary | ICD-10-CM | POA: Diagnosis not present

## 2022-07-12 DIAGNOSIS — Z5111 Encounter for antineoplastic chemotherapy: Secondary | ICD-10-CM

## 2022-07-12 DIAGNOSIS — D649 Anemia, unspecified: Secondary | ICD-10-CM

## 2022-07-12 DIAGNOSIS — C099 Malignant neoplasm of tonsil, unspecified: Secondary | ICD-10-CM

## 2022-07-12 DIAGNOSIS — R197 Diarrhea, unspecified: Secondary | ICD-10-CM

## 2022-07-12 DIAGNOSIS — T451X5A Adverse effect of antineoplastic and immunosuppressive drugs, initial encounter: Secondary | ICD-10-CM

## 2022-07-12 DIAGNOSIS — Z510 Encounter for antineoplastic radiation therapy: Secondary | ICD-10-CM | POA: Diagnosis not present

## 2022-07-12 LAB — RAD ONC ARIA SESSION SUMMARY
Course Elapsed Days: 8
Plan Fractions Treated to Date: 6
Plan Prescribed Dose Per Fraction: 2 Gy
Plan Total Fractions Prescribed: 35
Plan Total Prescribed Dose: 70 Gy
Reference Point Dosage Given to Date: 12 Gy
Reference Point Session Dosage Given: 2 Gy
Session Number: 6

## 2022-07-12 LAB — CMP (CANCER CENTER ONLY)
ALT: 27 U/L (ref 0–44)
AST: 26 U/L (ref 15–41)
Albumin: 4 g/dL (ref 3.5–5.0)
Alkaline Phosphatase: 69 U/L (ref 38–126)
Anion gap: 11 (ref 5–15)
BUN: 20 mg/dL (ref 8–23)
CO2: 22 mmol/L (ref 22–32)
Calcium: 9.7 mg/dL (ref 8.9–10.3)
Chloride: 104 mmol/L (ref 98–111)
Creatinine: 0.88 mg/dL (ref 0.61–1.24)
GFR, Estimated: >60 mL/min (ref >60)
Glucose, Bld: 154 mg/dL — ABNORMAL HIGH (ref 70–99)
Potassium: 3.8 mmol/L (ref 3.5–5.1)
Sodium: 137 mmol/L (ref 135–145)
Total Bilirubin: 0.6 mg/dL (ref 0.3–1.2)
Total Protein: 7.5 g/dL (ref 6.5–8.1)

## 2022-07-12 LAB — CBC WITH DIFFERENTIAL (CANCER CENTER ONLY)
Abs Immature Granulocytes: 0.05 10*3/uL (ref 0.00–0.07)
Basophils Absolute: 0 10*3/uL (ref 0.0–0.1)
Basophils Relative: 0 %
Eosinophils Absolute: 0.2 10*3/uL (ref 0.0–0.5)
Eosinophils Relative: 2 %
HCT: 41.3 % (ref 39.0–52.0)
Hemoglobin: 13.4 g/dL (ref 13.0–17.0)
Immature Granulocytes: 1 %
Lymphocytes Relative: 27 %
Lymphs Abs: 2.7 10*3/uL (ref 0.7–4.0)
MCH: 25.7 pg — ABNORMAL LOW (ref 26.0–34.0)
MCHC: 32.4 g/dL (ref 30.0–36.0)
MCV: 79.3 fL — ABNORMAL LOW (ref 80.0–100.0)
Monocytes Absolute: 0.7 10*3/uL (ref 0.1–1.0)
Monocytes Relative: 7 %
Neutro Abs: 6.3 10*3/uL (ref 1.7–7.7)
Neutrophils Relative %: 63 %
Platelet Count: 327 10*3/uL (ref 150–400)
RBC: 5.21 MIL/uL (ref 4.22–5.81)
RDW: 14.3 % (ref 11.5–15.5)
WBC Count: 10 10*3/uL (ref 4.0–10.5)
nRBC: 0 % (ref 0.0–0.2)

## 2022-07-12 LAB — MAGNESIUM: Magnesium: 1.9 mg/dL (ref 1.7–2.4)

## 2022-07-12 MED ORDER — POTASSIUM CHLORIDE IN NACL 20-0.9 MEQ/L-% IV SOLN
Freq: Once | INTRAVENOUS | Status: AC
  Filled 2022-07-12: qty 1000

## 2022-07-12 MED ORDER — PALONOSETRON HCL INJECTION 0.25 MG/5ML
0.2500 mg | Freq: Once | INTRAVENOUS | Status: AC
  Administered 2022-07-12: 0.25 mg via INTRAVENOUS
  Filled 2022-07-12: qty 5

## 2022-07-12 MED ORDER — SODIUM CHLORIDE 0.9 % IV SOLN
10.0000 mg | Freq: Once | INTRAVENOUS | Status: AC
  Administered 2022-07-12: 10 mg via INTRAVENOUS
  Filled 2022-07-12: qty 10

## 2022-07-12 MED ORDER — HEPARIN SOD (PORK) LOCK FLUSH 100 UNIT/ML IV SOLN
500.0000 [IU] | Freq: Once | INTRAVENOUS | Status: AC | PRN
  Administered 2022-07-12: 500 [IU]
  Filled 2022-07-12: qty 5

## 2022-07-12 MED ORDER — SODIUM CHLORIDE 0.9 % IV SOLN
40.0000 mg/m2 | Freq: Once | INTRAVENOUS | Status: AC
  Administered 2022-07-12: 78 mg via INTRAVENOUS
  Filled 2022-07-12: qty 78

## 2022-07-12 MED ORDER — SODIUM CHLORIDE 0.9% FLUSH
10.0000 mL | INTRAVENOUS | Status: DC | PRN
  Administered 2022-07-12: 10 mL
  Filled 2022-07-12: qty 10

## 2022-07-12 MED ORDER — SODIUM CHLORIDE 0.9 % IV SOLN
Freq: Once | INTRAVENOUS | Status: AC
  Filled 2022-07-12: qty 250

## 2022-07-12 MED ORDER — SODIUM CHLORIDE 0.9 % IV SOLN
150.0000 mg | Freq: Once | INTRAVENOUS | Status: AC
  Administered 2022-07-12: 150 mg via INTRAVENOUS
  Filled 2022-07-12: qty 150

## 2022-07-12 MED ORDER — MAGNESIUM SULFATE 2 GM/50ML IV SOLN
2.0000 g | Freq: Once | INTRAVENOUS | Status: AC
  Administered 2022-07-12: 2 g via INTRAVENOUS
  Filled 2022-07-12: qty 50

## 2022-07-12 NOTE — Patient Instructions (Signed)
[facility]  Discharge Instructions: Thank you for choosing [facility] to provide your oncology and hematology care.  If you have a lab appointment with the Cancer Center, please go directly to the Cancer Center and check in at the registration area.  Wear comfortable clothing and clothing appropriate for easy access to any Portacath or PICC line.   We strive to give you quality time with your provider. You may need to reschedule your appointment if you arrive late (15 or more minutes).  Arriving late affects you and other patients whose appointments are after yours.  Also, if you miss three or more appointments without notifying the office, you may be dismissed from the clinic at the provider's discretion.      For prescription refill requests, have your pharmacy contact our office and allow 72 hours for refills to be completed.    Today you received the following chemotherapy and/or immunotherapy agents Cisplatin      To help prevent nausea and vomiting after your treatment, we encourage you to take your nausea medication as directed.  BELOW ARE SYMPTOMS THAT SHOULD BE REPORTED IMMEDIATELY: *FEVER GREATER THAN 100.4 F (38 C) OR HIGHER *CHILLS OR SWEATING *NAUSEA AND VOMITING THAT IS NOT CONTROLLED WITH YOUR NAUSEA MEDICATION *UNUSUAL SHORTNESS OF BREATH *UNUSUAL BRUISING OR BLEEDING *URINARY PROBLEMS (pain or burning when urinating, or frequent urination) *BOWEL PROBLEMS (unusual diarrhea, constipation, pain near the anus) TENDERNESS IN MOUTH AND THROAT WITH OR WITHOUT PRESENCE OF ULCERS (sore throat, sores in mouth, or a toothache) UNUSUAL RASH, SWELLING OR PAIN  UNUSUAL VAGINAL DISCHARGE OR ITCHING   Items with * indicate a potential emergency and should be followed up as soon as possible or go to the Emergency Department if any problems should occur.  Please show the CHEMOTHERAPY ALERT CARD or IMMUNOTHERAPY ALERT CARD at check-in to  the Emergency Department and triage nurse.  Should you have questions after your visit or need to cancel or reschedule your appointment, please contact [facility]  [phone] and follow the prompts.  Office hours are 8:00 a.m. to 4:30 p.m. Monday - Friday. Please note that voicemails left after 4:00 p.m. may not be returned until the following business day.  We are closed weekends and major holidays. You have access to a nurse at all times for urgent questions. Please call the main number to the clinic [phone] and follow the prompts.  For any non-urgent questions, you may also contact your provider using MyChart. We now offer e-Visits for anyone 18 and older to request care online for non-urgent symptoms. For details visit [email or web address].   Also download the MyChart app! Go to the app store, search "MyChart", open the app, select [facility] [facility], and log in with your MyChart username and password.    

## 2022-07-12 NOTE — Progress Notes (Signed)
Nutrition Follow-up:  Patient with stage IV right tonsil cancer, p 16+.  Patient receiving chemotherapy and radiation.    Met with patient and niece Archie Patten during infusion.  Reports good appetite.  Son is helping prepare meals.  Eating breakfast around 10am and then nothing else until 6pm supper.  Breakfast is usually 2 eggs, with sausage or bacon, homemade biscuits and fried potatoes.  Supper is usually meat and couple sides (last night chicken wing, cornbread and butter beans.  Reports no nausea but has had increase in loose stool.  Normal prior to chemo was 2 a day now up to 5-7 times a day.  Planning to start imodium.     Medications: reviewed  Labs: glucose 154  Anthropometrics:   Weight 159 lb 6.4 oz today 169 lb 9.6 oz on 5/23 170 lb on 5/15  6% weight loss in the last week??  And patient eating well??    NUTRITION DIAGNOSIS: Predicted sub optimal energy intake continues    INTERVENTION:  Patient willing to try samples of ensure complete, boost VHC, [employer] 1.4.   Coupons given Recipes for smoothies and other high calorie foods given today Encouraged taking imodium to slow down loose stools. Could be playing a role in weight loss Add snack and/or lunch early afternoon.  Should include source of protein.     MONITORING, EVALUATION, GOAL: weight trends, intake   NEXT VISIT: Friday, June 14 during infusion  Vergene Marland B. Freida Busman, RD, LDN Registered Dietitian [phone]

## 2022-07-12 NOTE — Assessment & Plan Note (Signed)
Pre-exisiting, slight worse since the start of chemo radiation.  Recommend imodium PRN

## 2022-07-12 NOTE — Assessment & Plan Note (Signed)
Chemotherapy plan as listed above 

## 2022-07-12 NOTE — Assessment & Plan Note (Signed)
Chronic, stable.  Continue monitor. 
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
## 2022-07-12 NOTE — Progress Notes (Signed)
Ok per MD to run post fluids with Cisplatin

## 2022-07-12 NOTE — Assessment & Plan Note (Signed)
Grade 1 Observation. Keep feet warm.

## 2022-07-12 NOTE — Assessment & Plan Note (Addendum)
Biopsy and PET scan results were reviewed and discussed with patient. 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  Pending ENT evaluation. P16 positive. Recommend concurrent chemotherapy cisplatin and radiation Labs are reviewed and discussed with patient. Proceed with cisplatin 40mg /m2   Follow-up with radiation oncology for radiation.

## 2022-07-15 ENCOUNTER — Other Ambulatory Visit: Payer: Self-pay

## 2022-07-15 ENCOUNTER — Ambulatory Visit
Admission: RE | Admit: 2022-07-15 | Discharge: 2022-07-15 | Disposition: A | Discharge status: Home / Self Care | Payer: Medicare Other | Source: Ambulatory Visit | Attending: Radiation Oncology | Admitting: Radiation Oncology

## 2022-07-15 DIAGNOSIS — Z510 Encounter for antineoplastic radiation therapy: Secondary | ICD-10-CM | POA: Diagnosis present

## 2022-07-15 DIAGNOSIS — Z87442 Personal history of urinary calculi: Secondary | ICD-10-CM | POA: Insufficient documentation

## 2022-07-15 DIAGNOSIS — Z806 Family history of leukemia: Secondary | ICD-10-CM | POA: Insufficient documentation

## 2022-07-15 DIAGNOSIS — Z5111 Encounter for antineoplastic chemotherapy: Secondary | ICD-10-CM | POA: Insufficient documentation

## 2022-07-15 DIAGNOSIS — Z79899 Other long term (current) drug therapy: Secondary | ICD-10-CM | POA: Diagnosis not present

## 2022-07-15 DIAGNOSIS — C099 Malignant neoplasm of tonsil, unspecified: Secondary | ICD-10-CM | POA: Diagnosis present

## 2022-07-15 DIAGNOSIS — Z7952 Long term (current) use of systemic steroids: Secondary | ICD-10-CM | POA: Diagnosis not present

## 2022-07-15 DIAGNOSIS — D649 Anemia, unspecified: Secondary | ICD-10-CM | POA: Diagnosis not present

## 2022-07-15 DIAGNOSIS — I700 Atherosclerosis of aorta: Secondary | ICD-10-CM | POA: Diagnosis not present

## 2022-07-15 DIAGNOSIS — Z8619 Personal history of other infectious and parasitic diseases: Secondary | ICD-10-CM | POA: Diagnosis not present

## 2022-07-15 DIAGNOSIS — Z87891 Personal history of nicotine dependence: Secondary | ICD-10-CM | POA: Insufficient documentation

## 2022-07-15 DIAGNOSIS — R197 Diarrhea, unspecified: Secondary | ICD-10-CM | POA: Diagnosis not present

## 2022-07-15 DIAGNOSIS — N644 Mastodynia: Secondary | ICD-10-CM | POA: Diagnosis not present

## 2022-07-15 DIAGNOSIS — N62 Hypertrophy of breast: Secondary | ICD-10-CM | POA: Diagnosis not present

## 2022-07-15 LAB — RAD ONC ARIA SESSION SUMMARY
Course Elapsed Days: 11
Plan Fractions Treated to Date: 7
Plan Prescribed Dose Per Fraction: 2 Gy
Plan Total Fractions Prescribed: 35
Plan Total Prescribed Dose: 70 Gy
Reference Point Dosage Given to Date: 14 Gy
Reference Point Session Dosage Given: 2 Gy
Session Number: 7

## 2022-07-16 ENCOUNTER — Other Ambulatory Visit: Payer: Self-pay

## 2022-07-16 ENCOUNTER — Ambulatory Visit
Admission: RE | Admit: 2022-07-16 | Discharge: 2022-07-16 | Disposition: A | Discharge status: Home / Self Care | Payer: Medicare Other | Source: Ambulatory Visit | Attending: Radiation Oncology | Admitting: Radiation Oncology

## 2022-07-16 DIAGNOSIS — Z510 Encounter for antineoplastic radiation therapy: Secondary | ICD-10-CM | POA: Diagnosis not present

## 2022-07-16 LAB — RAD ONC ARIA SESSION SUMMARY
Course Elapsed Days: 12
Plan Fractions Treated to Date: 8
Plan Prescribed Dose Per Fraction: 2 Gy
Plan Total Fractions Prescribed: 35
Plan Total Prescribed Dose: 70 Gy
Reference Point Dosage Given to Date: 16 Gy
Reference Point Session Dosage Given: 2 Gy
Session Number: 8

## 2022-07-17 ENCOUNTER — Ambulatory Visit
Admission: RE | Admit: 2022-07-17 | Discharge: 2022-07-17 | Disposition: A | Discharge status: Home / Self Care | Payer: Medicare Other | Source: Ambulatory Visit | Attending: Radiation Oncology | Admitting: Radiation Oncology

## 2022-07-17 ENCOUNTER — Other Ambulatory Visit: Payer: Self-pay

## 2022-07-17 DIAGNOSIS — Z510 Encounter for antineoplastic radiation therapy: Secondary | ICD-10-CM | POA: Diagnosis not present

## 2022-07-17 LAB — RAD ONC ARIA SESSION SUMMARY
Course Elapsed Days: 13
Plan Fractions Treated to Date: 9
Plan Prescribed Dose Per Fraction: 2 Gy
Plan Total Fractions Prescribed: 35
Plan Total Prescribed Dose: 70 Gy
Reference Point Dosage Given to Date: 18 Gy
Reference Point Session Dosage Given: 2 Gy
Session Number: 9

## 2022-07-18 ENCOUNTER — Ambulatory Visit: Payer: Medicare Other | Admitting: Oncology

## 2022-07-18 ENCOUNTER — Ambulatory Visit
Admission: RE | Admit: 2022-07-18 | Discharge: 2022-07-18 | Disposition: A | Discharge status: Home / Self Care | Payer: Medicare Other | Source: Ambulatory Visit | Attending: Radiation Oncology | Admitting: Radiation Oncology

## 2022-07-18 ENCOUNTER — Other Ambulatory Visit: Payer: Self-pay

## 2022-07-18 ENCOUNTER — Inpatient Hospital Stay: Payer: Medicare Other

## 2022-07-18 DIAGNOSIS — G620 Drug-induced polyneuropathy: Secondary | ICD-10-CM | POA: Insufficient documentation

## 2022-07-18 DIAGNOSIS — Z87442 Personal history of urinary calculi: Secondary | ICD-10-CM | POA: Insufficient documentation

## 2022-07-18 DIAGNOSIS — R200 Anesthesia of skin: Secondary | ICD-10-CM | POA: Insufficient documentation

## 2022-07-18 DIAGNOSIS — Z8619 Personal history of other infectious and parasitic diseases: Secondary | ICD-10-CM | POA: Insufficient documentation

## 2022-07-18 DIAGNOSIS — Z79630 Long term (current) use of alkylating agent: Secondary | ICD-10-CM | POA: Insufficient documentation

## 2022-07-18 DIAGNOSIS — Z806 Family history of leukemia: Secondary | ICD-10-CM | POA: Insufficient documentation

## 2022-07-18 DIAGNOSIS — T451X5A Adverse effect of antineoplastic and immunosuppressive drugs, initial encounter: Secondary | ICD-10-CM | POA: Insufficient documentation

## 2022-07-18 DIAGNOSIS — C099 Malignant neoplasm of tonsil, unspecified: Secondary | ICD-10-CM | POA: Insufficient documentation

## 2022-07-18 DIAGNOSIS — I700 Atherosclerosis of aorta: Secondary | ICD-10-CM | POA: Insufficient documentation

## 2022-07-18 DIAGNOSIS — R197 Diarrhea, unspecified: Secondary | ICD-10-CM | POA: Insufficient documentation

## 2022-07-18 DIAGNOSIS — R202 Paresthesia of skin: Secondary | ICD-10-CM | POA: Insufficient documentation

## 2022-07-18 DIAGNOSIS — Z79899 Other long term (current) drug therapy: Secondary | ICD-10-CM | POA: Insufficient documentation

## 2022-07-18 DIAGNOSIS — N644 Mastodynia: Secondary | ICD-10-CM | POA: Insufficient documentation

## 2022-07-18 DIAGNOSIS — Z5111 Encounter for antineoplastic chemotherapy: Secondary | ICD-10-CM | POA: Insufficient documentation

## 2022-07-18 DIAGNOSIS — N62 Hypertrophy of breast: Secondary | ICD-10-CM | POA: Insufficient documentation

## 2022-07-18 DIAGNOSIS — D649 Anemia, unspecified: Secondary | ICD-10-CM | POA: Insufficient documentation

## 2022-07-18 DIAGNOSIS — R5383 Other fatigue: Secondary | ICD-10-CM | POA: Insufficient documentation

## 2022-07-18 DIAGNOSIS — R112 Nausea with vomiting, unspecified: Secondary | ICD-10-CM | POA: Insufficient documentation

## 2022-07-18 DIAGNOSIS — Z7952 Long term (current) use of systemic steroids: Secondary | ICD-10-CM | POA: Insufficient documentation

## 2022-07-18 DIAGNOSIS — Z510 Encounter for antineoplastic radiation therapy: Secondary | ICD-10-CM | POA: Diagnosis not present

## 2022-07-18 DIAGNOSIS — R590 Localized enlarged lymph nodes: Secondary | ICD-10-CM | POA: Insufficient documentation

## 2022-07-18 DIAGNOSIS — Z87891 Personal history of nicotine dependence: Secondary | ICD-10-CM | POA: Insufficient documentation

## 2022-07-18 LAB — CBC WITH DIFFERENTIAL (CANCER CENTER ONLY)
Abs Immature Granulocytes: 0.03 10*3/uL (ref 0.00–0.07)
Basophils Absolute: 0 10*3/uL (ref 0.0–0.1)
Basophils Relative: 0 %
Eosinophils Absolute: 0.1 10*3/uL (ref 0.0–0.5)
Eosinophils Relative: 1 %
HCT: 39.5 % (ref 39.0–52.0)
Hemoglobin: 12.8 g/dL — ABNORMAL LOW (ref 13.0–17.0)
Immature Granulocytes: 0 %
Lymphocytes Relative: 22 %
Lymphs Abs: 1.9 10*3/uL (ref 0.7–4.0)
MCH: 25.9 pg — ABNORMAL LOW (ref 26.0–34.0)
MCHC: 32.4 g/dL (ref 30.0–36.0)
MCV: 80 fL (ref 80.0–100.0)
Monocytes Absolute: 0.5 10*3/uL (ref 0.1–1.0)
Monocytes Relative: 5 %
Neutro Abs: 6.2 10*3/uL (ref 1.7–7.7)
Neutrophils Relative %: 72 %
Platelet Count: 240 10*3/uL (ref 150–400)
RBC: 4.94 MIL/uL (ref 4.22–5.81)
RDW: 14.7 % (ref 11.5–15.5)
WBC Count: 8.7 10*3/uL (ref 4.0–10.5)
nRBC: 0 % (ref 0.0–0.2)

## 2022-07-18 LAB — CMP (CANCER CENTER ONLY)
ALT: 24 U/L (ref 0–44)
AST: 23 U/L (ref 15–41)
Albumin: 3.7 g/dL (ref 3.5–5.0)
Alkaline Phosphatase: 62 U/L (ref 38–126)
Anion gap: 8 (ref 5–15)
BUN: 23 mg/dL (ref 8–23)
CO2: 23 mmol/L (ref 22–32)
Calcium: 9.4 mg/dL (ref 8.9–10.3)
Chloride: 105 mmol/L (ref 98–111)
Creatinine: 0.9 mg/dL (ref 0.61–1.24)
GFR, Estimated: >60 mL/min (ref >60)
Glucose, Bld: 155 mg/dL — ABNORMAL HIGH (ref 70–99)
Potassium: 4.3 mmol/L (ref 3.5–5.1)
Sodium: 136 mmol/L (ref 135–145)
Total Bilirubin: 0.3 mg/dL (ref 0.3–1.2)
Total Protein: 6.6 g/dL (ref 6.5–8.1)

## 2022-07-18 LAB — RAD ONC ARIA SESSION SUMMARY
Course Elapsed Days: 14
Plan Fractions Treated to Date: 10
Plan Prescribed Dose Per Fraction: 2 Gy
Plan Total Fractions Prescribed: 35
Plan Total Prescribed Dose: 70 Gy
Reference Point Dosage Given to Date: 20 Gy
Reference Point Session Dosage Given: 2 Gy
Session Number: 10

## 2022-07-18 LAB — MAGNESIUM: Magnesium: 1.9 mg/dL (ref 1.7–2.4)

## 2022-07-18 MED FILL — Fosaprepitant Dimeglumine For IV Infusion 150 MG (Base Eq): INTRAVENOUS | Qty: 5 | Status: AC

## 2022-07-18 MED FILL — Dexamethasone Sodium Phosphate Inj 100 MG/10ML: INTRAMUSCULAR | Qty: 1 | Status: AC

## 2022-07-18 NOTE — Patient Instructions (Signed)

## 2022-07-19 ENCOUNTER — Inpatient Hospital Stay (HOSPITAL_BASED_OUTPATIENT_CLINIC_OR_DEPARTMENT_OTHER): Payer: Medicare Other | Admitting: Oncology

## 2022-07-19 ENCOUNTER — Ambulatory Visit: Payer: Medicare Other

## 2022-07-19 ENCOUNTER — Inpatient Hospital Stay: Payer: Medicare Other

## 2022-07-19 ENCOUNTER — Encounter: Payer: Self-pay | Admitting: Oncology

## 2022-07-19 ENCOUNTER — Other Ambulatory Visit: Payer: Self-pay

## 2022-07-19 ENCOUNTER — Ambulatory Visit
Admission: RE | Admit: 2022-07-19 | Discharge: 2022-07-19 | Disposition: A | Discharge status: Home / Self Care | Payer: Medicare Other | Source: Ambulatory Visit | Attending: Radiation Oncology | Admitting: Radiation Oncology

## 2022-07-19 VITALS — BP 133/75 | HR 72

## 2022-07-19 VITALS — BP 138/87 | HR 81 | Temp 95.4°F | Resp 18 | Wt 160.1 lb

## 2022-07-19 DIAGNOSIS — R197 Diarrhea, unspecified: Secondary | ICD-10-CM

## 2022-07-19 DIAGNOSIS — C099 Malignant neoplasm of tonsil, unspecified: Secondary | ICD-10-CM

## 2022-07-19 DIAGNOSIS — G620 Drug-induced polyneuropathy: Secondary | ICD-10-CM

## 2022-07-19 DIAGNOSIS — D649 Anemia, unspecified: Secondary | ICD-10-CM | POA: Diagnosis not present

## 2022-07-19 DIAGNOSIS — Z5111 Encounter for antineoplastic chemotherapy: Secondary | ICD-10-CM | POA: Diagnosis not present

## 2022-07-19 DIAGNOSIS — Z510 Encounter for antineoplastic radiation therapy: Secondary | ICD-10-CM | POA: Diagnosis not present

## 2022-07-19 DIAGNOSIS — T451X5A Adverse effect of antineoplastic and immunosuppressive drugs, initial encounter: Secondary | ICD-10-CM

## 2022-07-19 LAB — RAD ONC ARIA SESSION SUMMARY
Course Elapsed Days: 15
Plan Fractions Treated to Date: 11
Plan Prescribed Dose Per Fraction: 2 Gy
Plan Total Fractions Prescribed: 35
Plan Total Prescribed Dose: 70 Gy
Reference Point Dosage Given to Date: 22 Gy
Reference Point Session Dosage Given: 2 Gy
Session Number: 11

## 2022-07-19 MED ORDER — MAGNESIUM SULFATE 2 GM/50ML IV SOLN
2.0000 g | Freq: Once | INTRAVENOUS | Status: AC
  Administered 2022-07-19: 2 g via INTRAVENOUS
  Filled 2022-07-19: qty 50

## 2022-07-19 MED ORDER — MAGIC MOUTHWASH W/LIDOCAINE: 5.0000 mL | Freq: Four times a day (QID) | ORAL | 3 refills | Status: DC | PRN

## 2022-07-19 MED ORDER — PALONOSETRON HCL INJECTION 0.25 MG/5ML
0.2500 mg | Freq: Once | INTRAVENOUS | Status: AC
  Administered 2022-07-19: 0.25 mg via INTRAVENOUS
  Filled 2022-07-19: qty 5

## 2022-07-19 MED ORDER — SODIUM CHLORIDE 0.9 % IV SOLN
40.0000 mg/m2 | Freq: Once | INTRAVENOUS | Status: AC
  Administered 2022-07-19: 78 mg via INTRAVENOUS
  Filled 2022-07-19: qty 78

## 2022-07-19 MED ORDER — HEPARIN SOD (PORK) LOCK FLUSH 100 UNIT/ML IV SOLN
500.0000 [IU] | Freq: Once | INTRAVENOUS | Status: AC | PRN
  Administered 2022-07-19: 500 [IU]
  Filled 2022-07-19: qty 5

## 2022-07-19 MED ORDER — SODIUM CHLORIDE 0.9 % IV SOLN
150.0000 mg | Freq: Once | INTRAVENOUS | Status: AC
  Administered 2022-07-19: 150 mg via INTRAVENOUS
  Filled 2022-07-19: qty 150

## 2022-07-19 MED ORDER — SODIUM CHLORIDE 0.9 % IV SOLN
10.0000 mg | Freq: Once | INTRAVENOUS | Status: AC
  Administered 2022-07-19: 10 mg via INTRAVENOUS
  Filled 2022-07-19: qty 10

## 2022-07-19 MED ORDER — POTASSIUM CHLORIDE IN NACL 20-0.9 MEQ/L-% IV SOLN
Freq: Once | INTRAVENOUS | Status: AC
  Filled 2022-07-19: qty 1000

## 2022-07-19 MED ORDER — SODIUM CHLORIDE 0.9 % IV SOLN
Freq: Once | INTRAVENOUS | Status: AC
  Filled 2022-07-19: qty 250
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
## 2022-07-19 NOTE — Progress Notes (Signed)
OK to run post IV fluids + potassium with Cisplatin per Dr. Cathie Hoops.

## 2022-07-19 NOTE — Assessment & Plan Note (Signed)
Chemotherapy plan as listed above 

## 2022-07-19 NOTE — Assessment & Plan Note (Signed)
Pre-exisiting, stable.  Recommend imodium PRN

## 2022-07-19 NOTE — Patient Instructions (Signed)
[facility]  Discharge Instructions: Thank you for choosing [facility] to provide your oncology and hematology care.  If you have a lab appointment with the Cancer Center, please go directly to the Cancer Center and check in at the registration area.  Wear comfortable clothing and clothing appropriate for easy access to any Portacath or PICC line.   We strive to give you quality time with your provider. You may need to reschedule your appointment if you arrive late (15 or more minutes).  Arriving late affects you and other patients whose appointments are after yours.  Also, if you miss three or more appointments without notifying the office, you may be dismissed from the clinic at the provider's discretion.      For prescription refill requests, have your pharmacy contact our office and allow 72 hours for refills to be completed.    To help prevent nausea and vomiting after your treatment, we encourage you to take your nausea medication as directed.  BELOW ARE SYMPTOMS THAT SHOULD BE REPORTED IMMEDIATELY: *FEVER GREATER THAN 100.4 F (38 C) OR HIGHER *CHILLS OR SWEATING *NAUSEA AND VOMITING THAT IS NOT CONTROLLED WITH YOUR NAUSEA MEDICATION *UNUSUAL SHORTNESS OF BREATH *UNUSUAL BRUISING OR BLEEDING *URINARY PROBLEMS (pain or burning when urinating, or frequent urination) *BOWEL PROBLEMS (unusual diarrhea, constipation, pain near the anus) TENDERNESS IN MOUTH AND THROAT WITH OR WITHOUT PRESENCE OF ULCERS (sore throat, sores in mouth, or a toothache) UNUSUAL RASH, SWELLING OR PAIN   Items with * indicate a potential emergency and should be followed up as soon as possible or go to the Emergency Department if any problems should occur.  Please show the CHEMOTHERAPY ALERT CARD or IMMUNOTHERAPY ALERT CARD at check-in to the Emergency Department and triage nurse.  Should you have questions after your visit or need to cancel or reschedule your  appointment, please contact [facility] [place] [facility]  [phone] and follow the prompts.  Office hours are 8:00 a.m. to 4:30 p.m. Monday - Friday. Please note that voicemails left after 4:00 p.m. may not be returned until the following business day.  We are closed weekends and major holidays. You have access to a nurse at all times for urgent questions. Please call the main number to the clinic [phone] and follow the prompts.  For any non-urgent questions, you may also contact your provider using MyChart. We now offer e-Visits for anyone 18 and older to request care online for non-urgent symptoms. For details visit [email or web address].   Also download the MyChart app! Go to the app store, search "MyChart", open the app, select [facility], and log in with your MyChart username and password.    

## 2022-07-19 NOTE — Assessment & Plan Note (Signed)
Grade 1 Observation. Keep feet warm.  

## 2022-07-19 NOTE — Assessment & Plan Note (Signed)
Chronic, stable.  Continue monitor. 

## 2022-07-19 NOTE — Assessment & Plan Note (Signed)
Biopsy and PET scan results were reviewed and discussed with patient. 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  Pending ENT evaluation. P16 positive. Recommend concurrent chemotherapy cisplatin and radiation Labs are reviewed and discussed with patient. Proceed with cisplatin 40mg/m2   Follow-up with radiation oncology for radiation. 

## 2022-07-22 ENCOUNTER — Ambulatory Visit
Admission: RE | Admit: 2022-07-22 | Discharge: 2022-07-22 | Disposition: A | Discharge status: Home / Self Care | Payer: Medicare Other | Source: Ambulatory Visit | Attending: Radiation Oncology | Admitting: Radiation Oncology

## 2022-07-22 ENCOUNTER — Other Ambulatory Visit: Payer: Self-pay

## 2022-07-22 DIAGNOSIS — Z510 Encounter for antineoplastic radiation therapy: Secondary | ICD-10-CM | POA: Diagnosis not present

## 2022-07-22 LAB — RAD ONC ARIA SESSION SUMMARY
Course Elapsed Days: 18
Plan Fractions Treated to Date: 12
Plan Prescribed Dose Per Fraction: 2 Gy
Plan Total Fractions Prescribed: 35
Plan Total Prescribed Dose: 70 Gy
Reference Point Dosage Given to Date: 24 Gy
Reference Point Session Dosage Given: 2 Gy
Session Number: 12

## 2022-07-23 ENCOUNTER — Ambulatory Visit
Admission: RE | Admit: 2022-07-23 | Discharge: 2022-07-23 | Disposition: A | Discharge status: Home / Self Care | Payer: Medicare Other | Source: Ambulatory Visit | Attending: Radiation Oncology | Admitting: Radiation Oncology

## 2022-07-23 ENCOUNTER — Other Ambulatory Visit: Payer: Self-pay

## 2022-07-23 DIAGNOSIS — Z510 Encounter for antineoplastic radiation therapy: Secondary | ICD-10-CM | POA: Diagnosis not present

## 2022-07-23 LAB — RAD ONC ARIA SESSION SUMMARY
Course Elapsed Days: 19
Plan Fractions Treated to Date: 13
Plan Prescribed Dose Per Fraction: 2 Gy
Plan Total Fractions Prescribed: 35
Plan Total Prescribed Dose: 70 Gy
Reference Point Dosage Given to Date: 26 Gy
Reference Point Session Dosage Given: 2 Gy
Session Number: 13

## 2022-07-24 ENCOUNTER — Ambulatory Visit
Admission: RE | Admit: 2022-07-24 | Discharge: 2022-07-24 | Disposition: A | Discharge status: Home / Self Care | Payer: Medicare Other | Source: Ambulatory Visit | Attending: Radiation Oncology | Admitting: Radiation Oncology

## 2022-07-24 ENCOUNTER — Other Ambulatory Visit: Payer: Self-pay

## 2022-07-24 DIAGNOSIS — Z510 Encounter for antineoplastic radiation therapy: Secondary | ICD-10-CM | POA: Diagnosis not present

## 2022-07-24 LAB — RAD ONC ARIA SESSION SUMMARY
Course Elapsed Days: 20
Plan Fractions Treated to Date: 14
Plan Prescribed Dose Per Fraction: 2 Gy
Plan Total Fractions Prescribed: 35
Plan Total Prescribed Dose: 70 Gy
Reference Point Dosage Given to Date: 28 Gy
Reference Point Session Dosage Given: 2 Gy
Session Number: 14

## 2022-07-25 ENCOUNTER — Ambulatory Visit
Admission: RE | Admit: 2022-07-25 | Discharge: 2022-07-25 | Disposition: A | Discharge status: Home / Self Care | Payer: Medicare Other | Source: Ambulatory Visit | Attending: Radiation Oncology | Admitting: Radiation Oncology

## 2022-07-25 ENCOUNTER — Other Ambulatory Visit: Payer: Self-pay

## 2022-07-25 DIAGNOSIS — Z510 Encounter for antineoplastic radiation therapy: Secondary | ICD-10-CM | POA: Diagnosis not present

## 2022-07-25 LAB — RAD ONC ARIA SESSION SUMMARY
Course Elapsed Days: 21
Plan Fractions Treated to Date: 15
Plan Prescribed Dose Per Fraction: 2 Gy
Plan Total Fractions Prescribed: 35
Plan Total Prescribed Dose: 70 Gy
Reference Point Dosage Given to Date: 30 Gy
Reference Point Session Dosage Given: 2 Gy
Session Number: 15

## 2022-07-25 MED FILL — Dexamethasone Sodium Phosphate Inj 100 MG/10ML: INTRAMUSCULAR | Qty: 1 | Status: AC

## 2022-07-25 MED FILL — Fosaprepitant Dimeglumine For IV Infusion 150 MG (Base Eq): INTRAVENOUS | Qty: 5 | Status: AC

## 2022-07-26 ENCOUNTER — Ambulatory Visit
Admission: RE | Admit: 2022-07-26 | Discharge: 2022-07-26 | Disposition: A | Discharge status: Home / Self Care | Payer: Medicare Other | Source: Ambulatory Visit | Attending: Radiation Oncology | Admitting: Radiation Oncology

## 2022-07-26 ENCOUNTER — Inpatient Hospital Stay: Payer: Medicare Other

## 2022-07-26 ENCOUNTER — Other Ambulatory Visit: Payer: Self-pay

## 2022-07-26 ENCOUNTER — Telehealth: Payer: Self-pay | Admitting: *Deleted

## 2022-07-26 ENCOUNTER — Encounter: Payer: Self-pay | Admitting: Oncology

## 2022-07-26 ENCOUNTER — Inpatient Hospital Stay (HOSPITAL_BASED_OUTPATIENT_CLINIC_OR_DEPARTMENT_OTHER): Payer: Medicare Other | Admitting: Oncology

## 2022-07-26 VITALS — BP 154/74 | HR 77 | Resp 16

## 2022-07-26 VITALS — BP 149/92 | HR 82 | Temp 95.0°F | Resp 18 | Wt 154.7 lb

## 2022-07-26 DIAGNOSIS — Z5111 Encounter for antineoplastic chemotherapy: Secondary | ICD-10-CM

## 2022-07-26 DIAGNOSIS — C099 Malignant neoplasm of tonsil, unspecified: Secondary | ICD-10-CM

## 2022-07-26 DIAGNOSIS — G620 Drug-induced polyneuropathy: Secondary | ICD-10-CM

## 2022-07-26 DIAGNOSIS — D649 Anemia, unspecified: Secondary | ICD-10-CM

## 2022-07-26 DIAGNOSIS — R110 Nausea: Secondary | ICD-10-CM | POA: Diagnosis not present

## 2022-07-26 DIAGNOSIS — R634 Abnormal weight loss: Secondary | ICD-10-CM | POA: Diagnosis not present

## 2022-07-26 DIAGNOSIS — Z510 Encounter for antineoplastic radiation therapy: Secondary | ICD-10-CM | POA: Diagnosis not present

## 2022-07-26 DIAGNOSIS — T451X5A Adverse effect of antineoplastic and immunosuppressive drugs, initial encounter: Secondary | ICD-10-CM | POA: Insufficient documentation

## 2022-07-26 LAB — CBC WITH DIFFERENTIAL (CANCER CENTER ONLY)
Abs Immature Granulocytes: 0 10*3/uL (ref 0.00–0.07)
Basophils Absolute: 0 10*3/uL (ref 0.0–0.1)
Basophils Relative: 0 %
Eosinophils Absolute: 0 10*3/uL (ref 0.0–0.5)
Eosinophils Relative: 0 %
HCT: 39.7 % (ref 39.0–52.0)
Hemoglobin: 13.1 g/dL (ref 13.0–17.0)
Immature Granulocytes: 0 %
Lymphocytes Relative: 25 %
Lymphs Abs: 1.3 10*3/uL (ref 0.7–4.0)
MCH: 25.9 pg — ABNORMAL LOW (ref 26.0–34.0)
MCHC: 33 g/dL (ref 30.0–36.0)
MCV: 78.5 fL — ABNORMAL LOW (ref 80.0–100.0)
Monocytes Absolute: 0.4 10*3/uL (ref 0.1–1.0)
Monocytes Relative: 7 %
Neutro Abs: 3.5 10*3/uL (ref 1.7–7.7)
Neutrophils Relative %: 68 %
Platelet Count: 227 10*3/uL (ref 150–400)
RBC: 5.06 MIL/uL (ref 4.22–5.81)
RDW: 15 % (ref 11.5–15.5)
WBC Count: 5.3 10*3/uL (ref 4.0–10.5)
nRBC: 0 % (ref 0.0–0.2)

## 2022-07-26 LAB — CMP (CANCER CENTER ONLY)
ALT: 35 U/L (ref 0–44)
AST: 30 U/L (ref 15–41)
Albumin: 4 g/dL (ref 3.5–5.0)
Alkaline Phosphatase: 72 U/L (ref 38–126)
Anion gap: 8 (ref 5–15)
BUN: 24 mg/dL — ABNORMAL HIGH (ref 8–23)
CO2: 24 mmol/L (ref 22–32)
Calcium: 9.9 mg/dL (ref 8.9–10.3)
Chloride: 104 mmol/L (ref 98–111)
Creatinine: 1.05 mg/dL (ref 0.61–1.24)
GFR, Estimated: >60 mL/min (ref >60)
Glucose, Bld: 153 mg/dL — ABNORMAL HIGH (ref 70–99)
Potassium: 3.8 mmol/L (ref 3.5–5.1)
Sodium: 136 mmol/L (ref 135–145)
Total Bilirubin: 0.5 mg/dL (ref 0.3–1.2)
Total Protein: 7.5 g/dL (ref 6.5–8.1)

## 2022-07-26 LAB — RAD ONC ARIA SESSION SUMMARY
Course Elapsed Days: 22
Plan Fractions Treated to Date: 16
Plan Prescribed Dose Per Fraction: 2 Gy
Plan Total Fractions Prescribed: 35
Plan Total Prescribed Dose: 70 Gy
Reference Point Dosage Given to Date: 32 Gy
Reference Point Session Dosage Given: 2 Gy
Session Number: 16

## 2022-07-26 LAB — MAGNESIUM: Magnesium: 2 mg/dL (ref 1.7–2.4)

## 2022-07-26 MED ORDER — SODIUM CHLORIDE 0.9 % IV SOLN
150.0000 mg | Freq: Once | INTRAVENOUS | Status: AC
  Administered 2022-07-26: 150 mg via INTRAVENOUS
  Filled 2022-07-26: qty 150

## 2022-07-26 MED ORDER — POTASSIUM CHLORIDE IN NACL 20-0.9 MEQ/L-% IV SOLN
Freq: Once | INTRAVENOUS | Status: AC
  Filled 2022-07-26: qty 1000

## 2022-07-26 MED ORDER — SODIUM CHLORIDE 0.9 % IV SOLN
Freq: Once | INTRAVENOUS | Status: AC
  Filled 2022-07-26: qty 250

## 2022-07-26 MED ORDER — SODIUM CHLORIDE 0.9% FLUSH
10.0000 mL | INTRAVENOUS | Status: DC | PRN
  Filled 2022-07-26: qty 10

## 2022-07-26 MED ORDER — SODIUM CHLORIDE 0.9 % IV SOLN
10.0000 mg | Freq: Once | INTRAVENOUS | Status: AC
  Administered 2022-07-26: 10 mg via INTRAVENOUS
  Filled 2022-07-26: qty 10

## 2022-07-26 MED ORDER — HEPARIN SOD (PORK) LOCK FLUSH 100 UNIT/ML IV SOLN
500.0000 [IU] | Freq: Once | INTRAVENOUS | Status: AC | PRN
  Administered 2022-07-26: 500 [IU]
  Filled 2022-07-26: qty 5

## 2022-07-26 MED ORDER — PALONOSETRON HCL INJECTION 0.25 MG/5ML
0.2500 mg | Freq: Once | INTRAVENOUS | Status: AC
  Administered 2022-07-26: 0.25 mg via INTRAVENOUS
  Filled 2022-07-26: qty 5

## 2022-07-26 MED ORDER — MAGNESIUM SULFATE 2 GM/50ML IV SOLN
2.0000 g | Freq: Once | INTRAVENOUS | Status: AC
  Administered 2022-07-26: 2 g via INTRAVENOUS
  Filled 2022-07-26: qty 50

## 2022-07-26 MED ORDER — DRONABINOL 5 MG PO CAPS: 5.0000 mg | ORAL_CAPSULE | Freq: Two times a day (BID) | ORAL | 0 refills | Status: DC

## 2022-07-26 MED ORDER — SODIUM CHLORIDE 0.9 % IV SOLN
40.0000 mg/m2 | Freq: Once | INTRAVENOUS | Status: AC
  Administered 2022-07-26: 78 mg via INTRAVENOUS
  Filled 2022-07-26: qty 78

## 2022-07-26 NOTE — Telephone Encounter (Signed)
Pharmacy called reportiong thtat Dronabinol is on national backorder and they only have 5 caps of the 5 mg in stock. She has  called around and no one has any 5  mg tabs. She is going to go ahead and dispense the  12 they have on hand, She states that [facility] in [place] has some 10 mg in stock if you wants to order that.

## 2022-07-26 NOTE — Assessment & Plan Note (Signed)
Grade 1 Observation. Keep feet warm.  

## 2022-07-26 NOTE — Progress Notes (Addendum)
Nutrition Follow-up:  Patient with stage IV right tonsil cancer, p 16+.  Patient receiving chemotherapy and radiation.   Met with patient and niece during infusion.  Reports that he has been nauseated since Wednesday but thought it was something he ate.  Having dry heaves this am.  Reports thick saliva.  Taste is gone.  Has not had any issues with diarrhea.  Has been trying to eat 3 meals a day (eggs, toast, grits, beef stew, cake, ice cream, candy).  Has not tried oral nutrition supplements.    Has not been taking dexamethasone at home   Medications: marinol added today  Labs: glucose 153, BUN 24, creatine 1.05  Anthropometrics:   Weight 154 lb 11.2 oz today  160 lb 1.6 oz on 6/7 159 lb 6.4 oz on 5/31 169 lb 9.6 oz on 5/23 170 lb on 5/15  9% weight loss in the last month, significant   NUTRITION DIAGNOSIS: Predicted sub optimal energy intake continues   INTERVENTION:  Encouraged taking antiemetics Encouraged patient to call for appointment with [facility] and possible IV fluids if needed next week Continue high calorie, high protein foods    MONITORING, EVALUATION, GOAL: weight trends, intake   NEXT VISIT: Friday, June 21 during infusion  Emil Klassen B. Freida Busman, RD, LDN Registered Dietitian [phone]

## 2022-07-26 NOTE — Assessment & Plan Note (Signed)
Chronic, stable.  Continue monitor. 
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
## 2022-07-26 NOTE — Assessment & Plan Note (Signed)
Biopsy and PET scan results were reviewed and discussed with patient. 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  Pending ENT evaluation. P16 positive. Recommend concurrent chemotherapy cisplatin and radiation Labs are reviewed and discussed with patient. Proceed with cisplatin 40mg/m2   Follow-up with radiation oncology for radiation. 

## 2022-07-26 NOTE — Progress Notes (Signed)
Per dr Cathie Hoops, okay to run hydration fluids with cisplatin.

## 2022-07-26 NOTE — Assessment & Plan Note (Signed)
Recommend patient to utilize his antiemetics.  Recommend Marinol 5mg  BID

## 2022-07-26 NOTE — Patient Instructions (Signed)
[facility]  Discharge Instructions: Thank you for choosing [facility] to provide your oncology and hematology care.  If you have a lab appointment with the Cancer Center, please go directly to the Cancer Center and check in at the registration area.  Wear comfortable clothing and clothing appropriate for easy access to any Portacath or PICC line.   We strive to give you quality time with your provider. You may need to reschedule your appointment if you arrive late (15 or more minutes).  Arriving late affects you and other patients whose appointments are after yours.  Also, if you miss three or more appointments without notifying the office, you may be dismissed from the clinic at the provider's discretion.      For prescription refill requests, have your pharmacy contact our office and allow 72 hours for refills to be completed.    To help prevent nausea and vomiting after your treatment, we encourage you to take your nausea medication as directed.  BELOW ARE SYMPTOMS THAT SHOULD BE REPORTED IMMEDIATELY: *FEVER GREATER THAN 100.4 F (38 C) OR HIGHER *CHILLS OR SWEATING *NAUSEA AND VOMITING THAT IS NOT CONTROLLED WITH YOUR NAUSEA MEDICATION *UNUSUAL SHORTNESS OF BREATH *UNUSUAL BRUISING OR BLEEDING *URINARY PROBLEMS (pain or burning when urinating, or frequent urination) *BOWEL PROBLEMS (unusual diarrhea, constipation, pain near the anus) TENDERNESS IN MOUTH AND THROAT WITH OR WITHOUT PRESENCE OF ULCERS (sore throat, sores in mouth, or a toothache) UNUSUAL RASH, SWELLING OR PAIN   Items with * indicate a potential emergency and should be followed up as soon as possible or go to the Emergency Department if any problems should occur.  Please show the CHEMOTHERAPY ALERT CARD or IMMUNOTHERAPY ALERT CARD at check-in to the Emergency Department and triage nurse.  Should you have questions after your visit or need to cancel or reschedule your  appointment, please contact [facility]  [phone] and follow the prompts.  Office hours are 8:00 a.m. to 4:30 p.m. Monday - Friday. Please note that voicemails left after 4:00 p.m. may not be returned until the following business day.  We are closed weekends and major holidays. You have access to a nurse at all times for urgent questions. Please call the main number to the clinic [phone] and follow the prompts.  For any non-urgent questions, you may also contact your provider using MyChart. We now offer e-Visits for anyone 18 and older to request care online for non-urgent symptoms. For details visit [email or web address].   Also download the MyChart app! Go to the app store, search "MyChart", open the app, select [place], and log in with your MyChart username and password.    

## 2022-07-26 NOTE — Assessment & Plan Note (Signed)
Chemotherapy plan as listed above 

## 2022-07-29 ENCOUNTER — Other Ambulatory Visit: Payer: Self-pay

## 2022-07-29 ENCOUNTER — Ambulatory Visit
Admission: RE | Admit: 2022-07-29 | Discharge: 2022-07-29 | Disposition: A | Discharge status: Home / Self Care | Payer: Medicare Other | Source: Ambulatory Visit | Attending: Radiation Oncology | Admitting: Radiation Oncology

## 2022-07-29 DIAGNOSIS — Z510 Encounter for antineoplastic radiation therapy: Secondary | ICD-10-CM | POA: Diagnosis not present

## 2022-07-29 LAB — RAD ONC ARIA SESSION SUMMARY
Course Elapsed Days: 25
Plan Fractions Treated to Date: 17
Plan Prescribed Dose Per Fraction: 2 Gy
Plan Total Fractions Prescribed: 35
Plan Total Prescribed Dose: 70 Gy
Reference Point Dosage Given to Date: 34 Gy
Reference Point Session Dosage Given: 2 Gy
Session Number: 17

## 2022-07-30 ENCOUNTER — Other Ambulatory Visit: Payer: Self-pay

## 2022-07-30 ENCOUNTER — Ambulatory Visit
Admission: RE | Admit: 2022-07-30 | Discharge: 2022-07-30 | Disposition: A | Discharge status: Home / Self Care | Payer: Medicare Other | Source: Ambulatory Visit | Attending: Radiation Oncology | Admitting: Radiation Oncology

## 2022-07-30 DIAGNOSIS — Z510 Encounter for antineoplastic radiation therapy: Secondary | ICD-10-CM | POA: Diagnosis not present

## 2022-07-30 LAB — RAD ONC ARIA SESSION SUMMARY
Course Elapsed Days: 26
Plan Fractions Treated to Date: 1
Plan Prescribed Dose Per Fraction: 2 Gy
Plan Total Fractions Prescribed: 18
Plan Total Prescribed Dose: 36 Gy
Reference Point Dosage Given to Date: 36 Gy
Reference Point Session Dosage Given: 2 Gy
Session Number: 18

## 2022-07-31 ENCOUNTER — Other Ambulatory Visit: Payer: Self-pay

## 2022-07-31 ENCOUNTER — Inpatient Hospital Stay: Payer: Medicare Other

## 2022-07-31 ENCOUNTER — Ambulatory Visit
Admission: RE | Admit: 2022-07-31 | Discharge: 2022-07-31 | Disposition: A | Discharge status: Home / Self Care | Payer: Medicare Other | Source: Ambulatory Visit | Attending: Radiation Oncology | Admitting: Radiation Oncology

## 2022-07-31 VITALS — BP 134/84 | HR 78 | Temp 95.1°F | Resp 20

## 2022-07-31 DIAGNOSIS — C099 Malignant neoplasm of tonsil, unspecified: Secondary | ICD-10-CM

## 2022-07-31 DIAGNOSIS — Z510 Encounter for antineoplastic radiation therapy: Secondary | ICD-10-CM | POA: Diagnosis not present

## 2022-07-31 LAB — RAD ONC ARIA SESSION SUMMARY
Course Elapsed Days: 27
Plan Fractions Treated to Date: 2
Plan Prescribed Dose Per Fraction: 2 Gy
Plan Total Fractions Prescribed: 18
Plan Total Prescribed Dose: 36 Gy
Reference Point Dosage Given to Date: 38 Gy
Reference Point Session Dosage Given: 2 Gy
Session Number: 19

## 2022-07-31 MED ORDER — SODIUM CHLORIDE 0.9% FLUSH
10.0000 mL | Freq: Once | INTRAVENOUS | Status: AC
  Administered 2022-07-31: 10 mL via INTRAVENOUS
  Filled 2022-07-31: qty 10

## 2022-07-31 MED ORDER — SODIUM CHLORIDE 0.9 % IV SOLN
Freq: Once | INTRAVENOUS | Status: AC
  Filled 2022-07-31: qty 250

## 2022-07-31 MED ORDER — HEPARIN SOD (PORK) LOCK FLUSH 100 UNIT/ML IV SOLN
500.0000 [IU] | Freq: Once | INTRAVENOUS | Status: AC
  Administered 2022-07-31: 500 [IU] via INTRAVENOUS
  Filled 2022-07-31: qty 5

## 2022-08-01 ENCOUNTER — Inpatient Hospital Stay: Payer: Medicare Other

## 2022-08-01 ENCOUNTER — Inpatient Hospital Stay (HOSPITAL_BASED_OUTPATIENT_CLINIC_OR_DEPARTMENT_OTHER): Payer: Medicare Other | Admitting: Oncology

## 2022-08-01 ENCOUNTER — Ambulatory Visit
Admission: RE | Admit: 2022-08-01 | Discharge: 2022-08-01 | Disposition: A | Discharge status: Home / Self Care | Payer: Medicare Other | Source: Ambulatory Visit | Attending: Radiation Oncology | Admitting: Radiation Oncology

## 2022-08-01 ENCOUNTER — Other Ambulatory Visit: Payer: Self-pay

## 2022-08-01 ENCOUNTER — Encounter: Payer: Self-pay | Admitting: Oncology

## 2022-08-01 VITALS — BP 123/79 | HR 77 | Temp 98.0°F | Resp 18 | Wt 154.3 lb

## 2022-08-01 DIAGNOSIS — G620 Drug-induced polyneuropathy: Secondary | ICD-10-CM

## 2022-08-01 DIAGNOSIS — D649 Anemia, unspecified: Secondary | ICD-10-CM | POA: Diagnosis not present

## 2022-08-01 DIAGNOSIS — T451X5A Adverse effect of antineoplastic and immunosuppressive drugs, initial encounter: Secondary | ICD-10-CM

## 2022-08-01 DIAGNOSIS — Z510 Encounter for antineoplastic radiation therapy: Secondary | ICD-10-CM | POA: Diagnosis not present

## 2022-08-01 DIAGNOSIS — C099 Malignant neoplasm of tonsil, unspecified: Secondary | ICD-10-CM

## 2022-08-01 DIAGNOSIS — Z5111 Encounter for antineoplastic chemotherapy: Secondary | ICD-10-CM

## 2022-08-01 DIAGNOSIS — R110 Nausea: Secondary | ICD-10-CM

## 2022-08-01 LAB — CBC WITH DIFFERENTIAL (CANCER CENTER ONLY)
Abs Immature Granulocytes: 0.01 10*3/uL (ref 0.00–0.07)
Basophils Absolute: 0 10*3/uL (ref 0.0–0.1)
Basophils Relative: 0 %
Eosinophils Absolute: 0 10*3/uL (ref 0.0–0.5)
Eosinophils Relative: 0 %
HCT: 37.6 % — ABNORMAL LOW (ref 39.0–52.0)
Hemoglobin: 12.3 g/dL — ABNORMAL LOW (ref 13.0–17.0)
Immature Granulocytes: 0 %
Lymphocytes Relative: 26 %
Lymphs Abs: 1 10*3/uL (ref 0.7–4.0)
MCH: 25.6 pg — ABNORMAL LOW (ref 26.0–34.0)
MCHC: 32.7 g/dL (ref 30.0–36.0)
MCV: 78.2 fL — ABNORMAL LOW (ref 80.0–100.0)
Monocytes Absolute: 0.4 10*3/uL (ref 0.1–1.0)
Monocytes Relative: 9 %
Neutro Abs: 2.6 10*3/uL (ref 1.7–7.7)
Neutrophils Relative %: 65 %
Platelet Count: 211 10*3/uL (ref 150–400)
RBC: 4.81 MIL/uL (ref 4.22–5.81)
RDW: 15.1 % (ref 11.5–15.5)
WBC Count: 4.1 10*3/uL (ref 4.0–10.5)
nRBC: 0 % (ref 0.0–0.2)

## 2022-08-01 LAB — CMP (CANCER CENTER ONLY)
ALT: 33 U/L (ref 0–44)
AST: 25 U/L (ref 15–41)
Albumin: 3.9 g/dL (ref 3.5–5.0)
Alkaline Phosphatase: 68 U/L (ref 38–126)
Anion gap: 10 (ref 5–15)
BUN: 20 mg/dL (ref 8–23)
CO2: 24 mmol/L (ref 22–32)
Calcium: 9.6 mg/dL (ref 8.9–10.3)
Chloride: 104 mmol/L (ref 98–111)
Creatinine: 0.91 mg/dL (ref 0.61–1.24)
GFR, Estimated: >60 mL/min (ref >60)
Glucose, Bld: 121 mg/dL — ABNORMAL HIGH (ref 70–99)
Potassium: 3.7 mmol/L (ref 3.5–5.1)
Sodium: 138 mmol/L (ref 135–145)
Total Bilirubin: 0.6 mg/dL (ref 0.3–1.2)
Total Protein: 6.9 g/dL (ref 6.5–8.1)

## 2022-08-01 LAB — RAD ONC ARIA SESSION SUMMARY
Course Elapsed Days: 28
Plan Fractions Treated to Date: 3
Plan Prescribed Dose Per Fraction: 2 Gy
Plan Total Fractions Prescribed: 18
Plan Total Prescribed Dose: 36 Gy
Reference Point Dosage Given to Date: 40 Gy
Reference Point Session Dosage Given: 2 Gy
Session Number: 20

## 2022-08-01 LAB — MAGNESIUM: Magnesium: 1.9 mg/dL (ref 1.7–2.4)

## 2022-08-01 MED ORDER — HEPARIN SOD (PORK) LOCK FLUSH 100 UNIT/ML IV SOLN
500.0000 [IU] | Freq: Once | INTRAVENOUS | Status: AC
  Administered 2022-08-01: 500 [IU] via INTRAVENOUS
  Filled 2022-08-01: qty 5

## 2022-08-01 MED ORDER — ONDANSETRON HCL 4 MG/2ML IJ SOLN
8.0000 mg | Freq: Once | INTRAMUSCULAR | Status: AC
  Administered 2022-08-01: 8 mg via INTRAVENOUS
  Filled 2022-08-01: qty 4

## 2022-08-01 MED ORDER — SODIUM CHLORIDE 0.9 % IV SOLN
Freq: Once | INTRAVENOUS | Status: AC
  Filled 2022-08-01: qty 250

## 2022-08-01 MED FILL — Fosaprepitant Dimeglumine For IV Infusion 150 MG (Base Eq): INTRAVENOUS | Qty: 5 | Status: AC

## 2022-08-01 MED FILL — Dexamethasone Sodium Phosphate Inj 100 MG/10ML: INTRAMUSCULAR | Qty: 1 | Status: AC

## 2022-08-01 NOTE — Assessment & Plan Note (Addendum)
Biopsy and PET scan results were reviewed and discussed with patient. 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  Pending ENT evaluation. P16 positive. Recommend concurrent chemotherapy cisplatin and radiation Labs are reviewed and discussed with patient. Proceed with cisplatin 40mg /m2  IV fluid hydration session started. Follow-up with radiation oncology for radiation.
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
## 2022-08-01 NOTE — Assessment & Plan Note (Signed)
Recommend patient to utilize his antiemetics.  Recommend Marinol 5mg  BID Added Ativan 0.5 mg every 12 hours as needed for anxiety/sleep/nausea vomiting.  Prescription sent to pharmacy.

## 2022-08-01 NOTE — Progress Notes (Signed)
Pt here for follow up. Pt reports that he would like to get weekly (WED) IVF and steroids.

## 2022-08-01 NOTE — Assessment & Plan Note (Signed)
Grade 1 Observation. Keep feet warm.  

## 2022-08-01 NOTE — Assessment & Plan Note (Signed)
Chronic, stable.  Continue monitor. 

## 2022-08-01 NOTE — Assessment & Plan Note (Signed)
Chemotherapy plan as listed above 

## 2022-08-02 ENCOUNTER — Inpatient Hospital Stay: Payer: Medicare Other

## 2022-08-02 ENCOUNTER — Other Ambulatory Visit: Payer: Self-pay

## 2022-08-02 ENCOUNTER — Ambulatory Visit
Admission: RE | Admit: 2022-08-02 | Discharge: 2022-08-02 | Disposition: A | Discharge status: Home / Self Care | Payer: Medicare Other | Source: Ambulatory Visit | Attending: Radiation Oncology | Admitting: Radiation Oncology

## 2022-08-02 VITALS — BP 144/76 | HR 75 | Temp 97.0°F | Resp 18

## 2022-08-02 DIAGNOSIS — Z510 Encounter for antineoplastic radiation therapy: Secondary | ICD-10-CM | POA: Diagnosis not present

## 2022-08-02 DIAGNOSIS — C099 Malignant neoplasm of tonsil, unspecified: Secondary | ICD-10-CM

## 2022-08-02 LAB — RAD ONC ARIA SESSION SUMMARY
Course Elapsed Days: 29
Plan Fractions Treated to Date: 4
Plan Prescribed Dose Per Fraction: 2 Gy
Plan Total Fractions Prescribed: 18
Plan Total Prescribed Dose: 36 Gy
Reference Point Dosage Given to Date: 42 Gy
Reference Point Session Dosage Given: 2 Gy
Session Number: 21

## 2022-08-02 MED ORDER — SODIUM CHLORIDE 0.9 % IV SOLN
Freq: Once | INTRAVENOUS | Status: AC
  Filled 2022-08-02: qty 250

## 2022-08-02 MED ORDER — PALONOSETRON HCL INJECTION 0.25 MG/5ML
0.2500 mg | Freq: Once | INTRAVENOUS | Status: AC
  Administered 2022-08-02: 0.25 mg via INTRAVENOUS
  Filled 2022-08-02: qty 5

## 2022-08-02 MED ORDER — HEPARIN SOD (PORK) LOCK FLUSH 100 UNIT/ML IV SOLN
500.0000 [IU] | Freq: Once | INTRAVENOUS | Status: AC | PRN
  Administered 2022-08-02: 500 [IU]
  Filled 2022-08-02: qty 5

## 2022-08-02 MED ORDER — LORAZEPAM 2 MG/ML IJ SOLN
0.5000 mg | Freq: Every day | INTRAMUSCULAR | Status: DC | PRN
  Administered 2022-08-02: 0.5 mg via INTRAVENOUS
  Filled 2022-08-02: qty 1

## 2022-08-02 MED ORDER — SODIUM CHLORIDE 0.9 % IV SOLN
40.0000 mg/m2 | Freq: Once | INTRAVENOUS | Status: AC
  Administered 2022-08-02: 78 mg via INTRAVENOUS
  Filled 2022-08-02: qty 33.83

## 2022-08-02 MED ORDER — MAGNESIUM SULFATE 2 GM/50ML IV SOLN
2.0000 g | Freq: Once | INTRAVENOUS | Status: AC
  Administered 2022-08-02: 2 g via INTRAVENOUS
  Filled 2022-08-02: qty 50

## 2022-08-02 MED ORDER — POTASSIUM CHLORIDE IN NACL 20-0.9 MEQ/L-% IV SOLN
Freq: Once | INTRAVENOUS | Status: AC
  Filled 2022-08-02: qty 1000

## 2022-08-02 MED ORDER — SODIUM CHLORIDE 0.9 % IV SOLN
10.0000 mg | Freq: Once | INTRAVENOUS | Status: AC
  Administered 2022-08-02: 10 mg via INTRAVENOUS
  Filled 2022-08-02: qty 10

## 2022-08-02 MED ORDER — SODIUM CHLORIDE 0.9 % IV SOLN
150.0000 mg | Freq: Once | INTRAVENOUS | Status: AC
  Administered 2022-08-02: 150 mg via INTRAVENOUS
  Filled 2022-08-02: qty 150

## 2022-08-02 NOTE — Progress Notes (Signed)
Nutrition Follow-up:   Patient with stage IV right tonsil cancer, p 16+.  Patient receiving chemotherapy and radiation.    Met with patient and niece during infusion.  Reports nausea/vomiting since Wednesday.  Received fluids yesterday and nausea medication.  Diarrhea has also returned.  Experiencing taste alterations.  Able to eat a hot dog last night.  Ate watermelon this am.  States that throat is sore but not to the point it is effecting intake of food.  Has taken marinol but only has 12 pills (on back order).  Magic Mouthwash ordered ([place] pharmacy has all components, other drug stores ingredients on back order).  Has not tried "milky" shakes recently.  Previously tried them and did not like them.      Medications: marinol, compazine, zofran, dexamethasone  Labs: glucose 121  Anthropometrics:   Weight 154 lb 4.8 oz on 6/20 154 lb 11.2 oz on 6/14 160 lb 1.6 oz on 6/7 159 lb 6.4 oz on 5/31 169 lb 9.6 oz on 5/23 170 lb on 5/15   NUTRITION DIAGNOSIS: Predicted sub optimal energy intake continues    INTERVENTION:  Continue antiemetics and fluids scheduled for next week Samples of ensure clear, pedialyte packets Samples of enterade provided as well to try.  Recommend 1 in the AM and 1 in PM.  Drink 30 minutes before a meal or 1 hour after eating and drinking.       MONITORING, EVALUATION, GOAL: weight trends, intake   NEXT VISIT: Friday, June 28 during infusion  Chriselda Leppert B. Freida Busman, RD, LDN Registered Dietitian [phone]

## 2022-08-02 NOTE — Patient Instructions (Signed)
[facility] [place] [facility]  Discharge Instructions: Thank you for choosing [facility] to provide your oncology and hematology care.  If you have a lab appointment with the Cancer Center, please go directly to the Cancer Center and check in at the registration area.  Wear comfortable clothing and clothing appropriate for easy access to any Portacath or PICC line.   We strive to give you quality time with your provider. You may need to reschedule your appointment if you arrive late (15 or more minutes).  Arriving late affects you and other patients whose appointments are after yours.  Also, if you miss three or more appointments without notifying the office, you may be dismissed from the clinic at the provider's discretion.      For prescription refill requests, have your pharmacy contact our office and allow 72 hours for refills to be completed.    Today you received the following chemotherapy and/or immunotherapy agents Cisplatin      To help prevent nausea and vomiting after your treatment, we encourage you to take your nausea medication as directed.  BELOW ARE SYMPTOMS THAT SHOULD BE REPORTED IMMEDIATELY: *FEVER GREATER THAN 100.4 F (38 C) OR HIGHER *CHILLS OR SWEATING *NAUSEA AND VOMITING THAT IS NOT CONTROLLED WITH YOUR NAUSEA MEDICATION *UNUSUAL SHORTNESS OF BREATH *UNUSUAL BRUISING OR BLEEDING *URINARY PROBLEMS (pain or burning when urinating, or frequent urination) *BOWEL PROBLEMS (unusual diarrhea, constipation, pain near the anus) TENDERNESS IN MOUTH AND THROAT WITH OR WITHOUT PRESENCE OF ULCERS (sore throat, sores in mouth, or a toothache) UNUSUAL RASH, SWELLING OR PAIN  UNUSUAL VAGINAL DISCHARGE OR ITCHING   Items with * indicate a potential emergency and should be followed up as soon as possible or go to the Emergency Department if any problems should occur.  Please show the CHEMOTHERAPY ALERT CARD or IMMUNOTHERAPY ALERT CARD at check-in to  the Emergency Department and triage nurse.  Should you have questions after your visit or need to cancel or reschedule your appointment, please contact [facility] [place] [facility]  [phone] and follow the prompts.  Office hours are 8:00 a.m. to 4:30 p.m. Monday - Friday. Please note that voicemails left after 4:00 p.m. may not be returned until the following business day.  We are closed weekends and major holidays. You have access to a nurse at all times for urgent questions. Please call the main number to the clinic [phone] and follow the prompts.  For any non-urgent questions, you may also contact your provider using MyChart. We now offer e-Visits for anyone 18 and older to request care online for non-urgent symptoms. For details visit [email or web address].   Also download the MyChart app! Go to the app store, search "MyChart", open the app, select [place], and log in with your MyChart username and password.    

## 2022-08-03 ENCOUNTER — Other Ambulatory Visit: Payer: Self-pay

## 2022-08-05 ENCOUNTER — Ambulatory Visit
Admission: RE | Admit: 2022-08-05 | Discharge: 2022-08-05 | Disposition: A | Discharge status: Home / Self Care | Payer: Medicare Other | Source: Ambulatory Visit | Attending: Radiation Oncology | Admitting: Radiation Oncology

## 2022-08-05 ENCOUNTER — Other Ambulatory Visit: Payer: Self-pay

## 2022-08-05 DIAGNOSIS — Z510 Encounter for antineoplastic radiation therapy: Secondary | ICD-10-CM | POA: Diagnosis not present

## 2022-08-05 LAB — RAD ONC ARIA SESSION SUMMARY
Course Elapsed Days: 32
Plan Fractions Treated to Date: 5
Plan Prescribed Dose Per Fraction: 2 Gy
Plan Total Fractions Prescribed: 18
Plan Total Prescribed Dose: 36 Gy
Reference Point Dosage Given to Date: 44 Gy
Reference Point Session Dosage Given: 2 Gy
Session Number: 22

## 2022-08-06 ENCOUNTER — Ambulatory Visit
Admission: RE | Admit: 2022-08-06 | Discharge: 2022-08-06 | Disposition: A | Discharge status: Home / Self Care | Payer: Medicare Other | Source: Ambulatory Visit | Attending: Radiation Oncology | Admitting: Radiation Oncology

## 2022-08-06 ENCOUNTER — Other Ambulatory Visit: Payer: Self-pay

## 2022-08-06 ENCOUNTER — Inpatient Hospital Stay: Payer: Medicare Other

## 2022-08-06 VITALS — BP 137/79 | HR 77 | Resp 20

## 2022-08-06 DIAGNOSIS — T451X5A Adverse effect of antineoplastic and immunosuppressive drugs, initial encounter: Secondary | ICD-10-CM

## 2022-08-06 DIAGNOSIS — Z510 Encounter for antineoplastic radiation therapy: Secondary | ICD-10-CM | POA: Diagnosis not present

## 2022-08-06 LAB — RAD ONC ARIA SESSION SUMMARY
Course Elapsed Days: 33
Plan Fractions Treated to Date: 6
Plan Prescribed Dose Per Fraction: 2 Gy
Plan Total Fractions Prescribed: 18
Plan Total Prescribed Dose: 36 Gy
Reference Point Dosage Given to Date: 46 Gy
Reference Point Session Dosage Given: 2 Gy
Session Number: 23

## 2022-08-06 MED ORDER — ONDANSETRON HCL 4 MG/2ML IJ SOLN
8.0000 mg | Freq: Once | INTRAMUSCULAR | Status: AC
  Administered 2022-08-06: 8 mg via INTRAVENOUS
  Filled 2022-08-06: qty 4

## 2022-08-06 MED ORDER — SODIUM CHLORIDE 0.9 % IV SOLN
Freq: Once | INTRAVENOUS | Status: AC
  Filled 2022-08-06: qty 250

## 2022-08-06 MED ORDER — SODIUM CHLORIDE 0.9% FLUSH
10.0000 mL | Freq: Once | INTRAVENOUS | Status: AC
  Administered 2022-08-06: 10 mL via INTRAVENOUS
  Filled 2022-08-06: qty 10

## 2022-08-06 MED ORDER — HEPARIN SOD (PORK) LOCK FLUSH 100 UNIT/ML IV SOLN
500.0000 [IU] | Freq: Once | INTRAVENOUS | Status: AC
  Administered 2022-08-06: 500 [IU] via INTRAVENOUS
  Filled 2022-08-06: qty 5

## 2022-08-07 ENCOUNTER — Other Ambulatory Visit: Payer: Self-pay

## 2022-08-07 ENCOUNTER — Ambulatory Visit
Admission: RE | Admit: 2022-08-07 | Discharge: 2022-08-07 | Disposition: A | Discharge status: Home / Self Care | Payer: Medicare Other | Source: Ambulatory Visit | Attending: Radiation Oncology | Admitting: Radiation Oncology

## 2022-08-07 DIAGNOSIS — Z510 Encounter for antineoplastic radiation therapy: Secondary | ICD-10-CM | POA: Diagnosis not present

## 2022-08-07 LAB — RAD ONC ARIA SESSION SUMMARY
Course Elapsed Days: 34
Plan Fractions Treated to Date: 7
Plan Prescribed Dose Per Fraction: 2 Gy
Plan Total Fractions Prescribed: 18
Plan Total Prescribed Dose: 36 Gy
Reference Point Dosage Given to Date: 48 Gy
Reference Point Session Dosage Given: 2 Gy
Session Number: 24

## 2022-08-08 ENCOUNTER — Other Ambulatory Visit: Payer: Self-pay

## 2022-08-08 ENCOUNTER — Ambulatory Visit
Admission: RE | Admit: 2022-08-08 | Discharge: 2022-08-08 | Disposition: A | Discharge status: Home / Self Care | Payer: Medicare Other | Source: Ambulatory Visit | Attending: Radiation Oncology | Admitting: Radiation Oncology

## 2022-08-08 ENCOUNTER — Inpatient Hospital Stay (HOSPITAL_BASED_OUTPATIENT_CLINIC_OR_DEPARTMENT_OTHER): Payer: Medicare Other | Admitting: Oncology

## 2022-08-08 ENCOUNTER — Encounter: Payer: Self-pay | Admitting: Oncology

## 2022-08-08 ENCOUNTER — Inpatient Hospital Stay: Payer: Medicare Other

## 2022-08-08 VITALS — BP 152/83 | HR 70 | Temp 96.3°F | Resp 18 | Wt 152.3 lb

## 2022-08-08 DIAGNOSIS — C099 Malignant neoplasm of tonsil, unspecified: Secondary | ICD-10-CM

## 2022-08-08 DIAGNOSIS — D649 Anemia, unspecified: Secondary | ICD-10-CM | POA: Diagnosis not present

## 2022-08-08 DIAGNOSIS — Z5111 Encounter for antineoplastic chemotherapy: Secondary | ICD-10-CM | POA: Diagnosis not present

## 2022-08-08 DIAGNOSIS — G620 Drug-induced polyneuropathy: Secondary | ICD-10-CM | POA: Diagnosis not present

## 2022-08-08 DIAGNOSIS — R197 Diarrhea, unspecified: Secondary | ICD-10-CM

## 2022-08-08 DIAGNOSIS — K1230 Oral mucositis (ulcerative), unspecified: Secondary | ICD-10-CM | POA: Insufficient documentation

## 2022-08-08 DIAGNOSIS — T451X5A Adverse effect of antineoplastic and immunosuppressive drugs, initial encounter: Secondary | ICD-10-CM

## 2022-08-08 DIAGNOSIS — E860 Dehydration: Secondary | ICD-10-CM

## 2022-08-08 DIAGNOSIS — R110 Nausea: Secondary | ICD-10-CM

## 2022-08-08 DIAGNOSIS — Z510 Encounter for antineoplastic radiation therapy: Secondary | ICD-10-CM | POA: Diagnosis not present

## 2022-08-08 LAB — RAD ONC ARIA SESSION SUMMARY
Course Elapsed Days: 35
Plan Fractions Treated to Date: 8
Plan Prescribed Dose Per Fraction: 2 Gy
Plan Total Fractions Prescribed: 18
Plan Total Prescribed Dose: 36 Gy
Reference Point Dosage Given to Date: 50 Gy
Reference Point Session Dosage Given: 2 Gy
Session Number: 25

## 2022-08-08 LAB — CBC WITH DIFFERENTIAL (CANCER CENTER ONLY)
Abs Immature Granulocytes: 0.01 10*3/uL (ref 0.00–0.07)
Basophils Absolute: 0 10*3/uL (ref 0.0–0.1)
Basophils Relative: 0 %
Eosinophils Absolute: 0 10*3/uL (ref 0.0–0.5)
Eosinophils Relative: 0 %
HCT: 35 % — ABNORMAL LOW (ref 39.0–52.0)
Hemoglobin: 11.8 g/dL — ABNORMAL LOW (ref 13.0–17.0)
Immature Granulocytes: 0 %
Lymphocytes Relative: 29 %
Lymphs Abs: 1 10*3/uL (ref 0.7–4.0)
MCH: 26 pg (ref 26.0–34.0)
MCHC: 33.7 g/dL (ref 30.0–36.0)
MCV: 77.3 fL — ABNORMAL LOW (ref 80.0–100.0)
Monocytes Absolute: 0.3 10*3/uL (ref 0.1–1.0)
Monocytes Relative: 9 %
Neutro Abs: 2.2 10*3/uL (ref 1.7–7.7)
Neutrophils Relative %: 62 %
Platelet Count: 159 10*3/uL (ref 150–400)
RBC: 4.53 MIL/uL (ref 4.22–5.81)
RDW: 15.5 % (ref 11.5–15.5)
WBC Count: 3.6 10*3/uL — ABNORMAL LOW (ref 4.0–10.5)
nRBC: 0 % (ref 0.0–0.2)

## 2022-08-08 LAB — CMP (CANCER CENTER ONLY)
ALT: 33 U/L (ref 0–44)
AST: 29 U/L (ref 15–41)
Albumin: 3.9 g/dL (ref 3.5–5.0)
Alkaline Phosphatase: 67 U/L (ref 38–126)
Anion gap: 14 (ref 5–15)
BUN: 20 mg/dL (ref 8–23)
CO2: 23 mmol/L (ref 22–32)
Calcium: 9.4 mg/dL (ref 8.9–10.3)
Chloride: 99 mmol/L (ref 98–111)
Creatinine: 1.05 mg/dL (ref 0.61–1.24)
GFR, Estimated: >60 mL/min (ref >60)
Glucose, Bld: 98 mg/dL (ref 70–99)
Potassium: 3.5 mmol/L (ref 3.5–5.1)
Sodium: 136 mmol/L (ref 135–145)
Total Bilirubin: 0.6 mg/dL (ref 0.3–1.2)
Total Protein: 6.8 g/dL (ref 6.5–8.1)

## 2022-08-08 LAB — MAGNESIUM: Magnesium: 1.8 mg/dL (ref 1.7–2.4)

## 2022-08-08 MED ORDER — LOPERAMIDE HCL 2 MG PO CAPS: 2.0000 mg | ORAL_CAPSULE | ORAL | 0 refills | Status: DC

## 2022-08-08 MED FILL — Fosaprepitant Dimeglumine For IV Infusion 150 MG (Base Eq): INTRAVENOUS | Qty: 5 | Status: AC

## 2022-08-08 MED FILL — Dexamethasone Sodium Phosphate Inj 100 MG/10ML: INTRAMUSCULAR | Qty: 1 | Status: AC

## 2022-08-08 NOTE — Assessment & Plan Note (Addendum)
Biopsy and PET scan results were reviewed and discussed with patient. 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  Pending ENT evaluation. P16 positive. Recommend concurrent chemotherapy cisplatin and radiation Labs are reviewed and discussed with patient. Proceed with cisplatin 40mg /m2  IV fluid hydration session PRN Follow-up with radiation oncology for radiation.

## 2022-08-08 NOTE — Assessment & Plan Note (Signed)
Chronic, stable.  Continue monitor. 

## 2022-08-08 NOTE — Assessment & Plan Note (Signed)
Encourage patient to utilize magic mouth wash.

## 2022-08-08 NOTE — Assessment & Plan Note (Signed)
Chemotherapy plan as listed above 

## 2022-08-08 NOTE — Assessment & Plan Note (Signed)
Pre-exisiting, stable.  Recommend imodium PRN 

## 2022-08-08 NOTE — Assessment & Plan Note (Addendum)
Recommend patient to utilize his antiemetics.  I have recommended Marinol 5mg  BID, he is not taking Ativan 0.5 mg every 12 hours as needed for anxiety/sleep/nausea vomiting.    Acid reflux, recommend omeprazole 20mg  daily. He declined Rx, will buy otc supplies
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
## 2022-08-08 NOTE — Progress Notes (Signed)
Pt here for follow up. Pt reports that over the past week his appetitive has been poor, he has been vomiting episodes and diarrhea.

## 2022-08-08 NOTE — Assessment & Plan Note (Signed)
Grade 1 Observation. Keep feet warm.  

## 2022-08-09 ENCOUNTER — Inpatient Hospital Stay: Payer: Medicare Other

## 2022-08-09 ENCOUNTER — Ambulatory Visit
Admission: RE | Admit: 2022-08-09 | Discharge: 2022-08-09 | Disposition: A | Discharge status: Home / Self Care | Payer: Medicare Other | Source: Ambulatory Visit | Attending: Radiation Oncology | Admitting: Radiation Oncology

## 2022-08-09 ENCOUNTER — Other Ambulatory Visit: Payer: Self-pay

## 2022-08-09 VITALS — BP 133/85 | HR 74 | Temp 94.4°F | Resp 19

## 2022-08-09 DIAGNOSIS — Z510 Encounter for antineoplastic radiation therapy: Secondary | ICD-10-CM | POA: Diagnosis not present

## 2022-08-09 DIAGNOSIS — C099 Malignant neoplasm of tonsil, unspecified: Secondary | ICD-10-CM

## 2022-08-09 LAB — RAD ONC ARIA SESSION SUMMARY
Course Elapsed Days: 36
Plan Fractions Treated to Date: 9
Plan Prescribed Dose Per Fraction: 2 Gy
Plan Total Fractions Prescribed: 18
Plan Total Prescribed Dose: 36 Gy
Reference Point Dosage Given to Date: 52 Gy
Reference Point Session Dosage Given: 2 Gy
Session Number: 26

## 2022-08-09 MED ORDER — SODIUM CHLORIDE 0.9 % IV SOLN
150.0000 mg | Freq: Once | INTRAVENOUS | Status: AC
  Administered 2022-08-09: 150 mg via INTRAVENOUS
  Filled 2022-08-09: qty 150

## 2022-08-09 MED ORDER — SODIUM CHLORIDE 0.9 % IV SOLN
10.0000 mg | Freq: Once | INTRAVENOUS | Status: AC
  Administered 2022-08-09: 10 mg via INTRAVENOUS
  Filled 2022-08-09: qty 10

## 2022-08-09 MED ORDER — HEPARIN SOD (PORK) LOCK FLUSH 100 UNIT/ML IV SOLN
500.0000 [IU] | Freq: Once | INTRAVENOUS | Status: AC | PRN
  Administered 2022-08-09: 500 [IU]
  Filled 2022-08-09: qty 5

## 2022-08-09 MED ORDER — MAGNESIUM SULFATE 2 GM/50ML IV SOLN
2.0000 g | Freq: Once | INTRAVENOUS | Status: AC
  Administered 2022-08-09: 2 g via INTRAVENOUS
  Filled 2022-08-09: qty 50

## 2022-08-09 MED ORDER — POTASSIUM CHLORIDE IN NACL 20-0.9 MEQ/L-% IV SOLN
Freq: Once | INTRAVENOUS | Status: AC
  Filled 2022-08-09: qty 1000

## 2022-08-09 MED ORDER — SODIUM CHLORIDE 0.9 % IV SOLN
40.0000 mg/m2 | Freq: Once | INTRAVENOUS | Status: AC
  Administered 2022-08-09: 78 mg via INTRAVENOUS
  Filled 2022-08-09: qty 33.69

## 2022-08-09 MED ORDER — SODIUM CHLORIDE 0.9 % IV SOLN
Freq: Once | INTRAVENOUS | Status: AC
  Filled 2022-08-09: qty 250

## 2022-08-09 MED ORDER — PALONOSETRON HCL INJECTION 0.25 MG/5ML
0.2500 mg | Freq: Once | INTRAVENOUS | Status: AC
  Administered 2022-08-09: 0.25 mg via INTRAVENOUS
  Filled 2022-08-09: qty 5

## 2022-08-09 NOTE — Patient Instructions (Signed)
[facility] [place] [facility]  Discharge Instructions: Thank you for choosing [facility] to provide your oncology and hematology care.  If you have a lab appointment with the Cancer Center, please go directly to the Cancer Center and check in at the registration area.  Wear comfortable clothing and clothing appropriate for easy access to any Portacath or PICC line.   We strive to give you quality time with your provider. You may need to reschedule your appointment if you arrive late (15 or more minutes).  Arriving late affects you and other patients whose appointments are after yours.  Also, if you miss three or more appointments without notifying the office, you may be dismissed from the clinic at the provider's discretion.      For prescription refill requests, have your pharmacy contact our office and allow 72 hours for refills to be completed.    Today you received the following chemotherapy and/or immunotherapy agents- cisplatin      To help prevent nausea and vomiting after your treatment, we encourage you to take your nausea medication as directed.  BELOW ARE SYMPTOMS THAT SHOULD BE REPORTED IMMEDIATELY: *FEVER GREATER THAN 100.4 F (38 C) OR HIGHER *CHILLS OR SWEATING *NAUSEA AND VOMITING THAT IS NOT CONTROLLED WITH YOUR NAUSEA MEDICATION *UNUSUAL SHORTNESS OF BREATH *UNUSUAL BRUISING OR BLEEDING *URINARY PROBLEMS (pain or burning when urinating, or frequent urination) *BOWEL PROBLEMS (unusual diarrhea, constipation, pain near the anus) TENDERNESS IN MOUTH AND THROAT WITH OR WITHOUT PRESENCE OF ULCERS (sore throat, sores in mouth, or a toothache) UNUSUAL RASH, SWELLING OR PAIN  UNUSUAL VAGINAL DISCHARGE OR ITCHING   Items with * indicate a potential emergency and should be followed up as soon as possible or go to the Emergency Department if any problems should occur.  Please show the CHEMOTHERAPY ALERT CARD or IMMUNOTHERAPY ALERT CARD at check-in to  the Emergency Department and triage nurse.  Should you have questions after your visit or need to cancel or reschedule your appointment, please contact [facility] [place] [facility]  [phone] and follow the prompts.  Office hours are 8:00 a.m. to 4:30 p.m. Monday - Friday. Please note that voicemails left after 4:00 p.m. may not be returned until the following business day.  We are closed weekends and major holidays. You have access to a nurse at all times for urgent questions. Please call the main number to the clinic [phone] and follow the prompts.  For any non-urgent questions, you may also contact your provider using MyChart. We now offer e-Visits for anyone 18 and older to request care online for non-urgent symptoms. For details visit [email or web address].   Also download the MyChart app! Go to the app store, search "MyChart", open the app, select [facility], and log in with your MyChart username and password.    

## 2022-08-09 NOTE — Progress Notes (Signed)
Per Cathie Hoops, MD, ok to run post cisplatin hydration fluids with cisplatin.

## 2022-08-09 NOTE — Progress Notes (Signed)
Nutrition Follow-up:  Patient with stage IV right tonsil cancer, p 16+.  Patient receiving chemotherapy and radiation.   Met with patient and niece during infusion.  Reports nausea and vomiting and diarrhea (lasting 2-3 days) over this past week.  Usually feels good on Saturday, then Sunday starts to feel bad and continues for the remainder of the week.  Ate some watermelon before coming today. Yesterday able to eat [employer] and Rice soup, cucumbers and squash.  Unable to tolerate ensure clear due to nausea.  Pedialyte packets burn his mouth.  "I had a bad week.This has been the worst week."  Also having issues with reflux.      Medications: starting omeprazole and ativan  Labs: reviewed  Anthropometrics:   Weight 152 lb 4.8 oz on 6/27  154 lb 4.8 oz on 6/20 160 lb 1.6 oz on 6/7 159 lb 6.4 oz on 5/31 169 lb 9.6 oz on 5/23 170 lb on 5/15   NUTRITION DIAGNOSIS: Predicted sub optimal energy intake continues    INTERVENTION:  Banatrol Plus given for patient to try for diarrhea Encouraged antinausea medication to help with symptoms Encouraged reflux medication    MONITORING, EVALUATION, GOAL: weight trends, intake   NEXT VISIT: Friday, July 12 during infusion  Finnigan Warriner B. Freida Busman, RD, LDN Registered Dietitian [phone]

## 2022-08-12 ENCOUNTER — Ambulatory Visit
Admission: RE | Admit: 2022-08-12 | Discharge: 2022-08-12 | Disposition: A | Discharge status: Home / Self Care | Payer: Medicare Other | Source: Ambulatory Visit | Attending: Radiation Oncology | Admitting: Radiation Oncology

## 2022-08-12 ENCOUNTER — Other Ambulatory Visit: Payer: Self-pay

## 2022-08-12 DIAGNOSIS — R202 Paresthesia of skin: Secondary | ICD-10-CM | POA: Diagnosis not present

## 2022-08-12 DIAGNOSIS — E781 Pure hyperglyceridemia: Secondary | ICD-10-CM | POA: Diagnosis not present

## 2022-08-12 DIAGNOSIS — K1230 Oral mucositis (ulcerative), unspecified: Secondary | ICD-10-CM | POA: Diagnosis not present

## 2022-08-12 DIAGNOSIS — G620 Drug-induced polyneuropathy: Secondary | ICD-10-CM | POA: Diagnosis not present

## 2022-08-12 DIAGNOSIS — C099 Malignant neoplasm of tonsil, unspecified: Secondary | ICD-10-CM | POA: Insufficient documentation

## 2022-08-12 DIAGNOSIS — R112 Nausea with vomiting, unspecified: Secondary | ICD-10-CM | POA: Insufficient documentation

## 2022-08-12 DIAGNOSIS — K219 Gastro-esophageal reflux disease without esophagitis: Secondary | ICD-10-CM | POA: Diagnosis not present

## 2022-08-12 DIAGNOSIS — R197 Diarrhea, unspecified: Secondary | ICD-10-CM | POA: Insufficient documentation

## 2022-08-12 DIAGNOSIS — Z7952 Long term (current) use of systemic steroids: Secondary | ICD-10-CM | POA: Insufficient documentation

## 2022-08-12 DIAGNOSIS — Z87442 Personal history of urinary calculi: Secondary | ICD-10-CM | POA: Insufficient documentation

## 2022-08-12 DIAGNOSIS — Z8619 Personal history of other infectious and parasitic diseases: Secondary | ICD-10-CM | POA: Insufficient documentation

## 2022-08-12 DIAGNOSIS — Z87891 Personal history of nicotine dependence: Secondary | ICD-10-CM | POA: Insufficient documentation

## 2022-08-12 DIAGNOSIS — Z510 Encounter for antineoplastic radiation therapy: Secondary | ICD-10-CM | POA: Insufficient documentation

## 2022-08-12 DIAGNOSIS — N644 Mastodynia: Secondary | ICD-10-CM | POA: Insufficient documentation

## 2022-08-12 DIAGNOSIS — D709 Neutropenia, unspecified: Secondary | ICD-10-CM | POA: Insufficient documentation

## 2022-08-12 DIAGNOSIS — N62 Hypertrophy of breast: Secondary | ICD-10-CM | POA: Insufficient documentation

## 2022-08-12 DIAGNOSIS — Z79899 Other long term (current) drug therapy: Secondary | ICD-10-CM | POA: Insufficient documentation

## 2022-08-12 DIAGNOSIS — R630 Anorexia: Secondary | ICD-10-CM | POA: Diagnosis not present

## 2022-08-12 DIAGNOSIS — K208 Other esophagitis without bleeding: Secondary | ICD-10-CM | POA: Diagnosis not present

## 2022-08-12 DIAGNOSIS — D649 Anemia, unspecified: Secondary | ICD-10-CM | POA: Insufficient documentation

## 2022-08-12 DIAGNOSIS — T451X5A Adverse effect of antineoplastic and immunosuppressive drugs, initial encounter: Secondary | ICD-10-CM | POA: Insufficient documentation

## 2022-08-12 DIAGNOSIS — Z5111 Encounter for antineoplastic chemotherapy: Secondary | ICD-10-CM | POA: Insufficient documentation

## 2022-08-12 DIAGNOSIS — Z806 Family history of leukemia: Secondary | ICD-10-CM | POA: Insufficient documentation

## 2022-08-12 DIAGNOSIS — I700 Atherosclerosis of aorta: Secondary | ICD-10-CM | POA: Insufficient documentation

## 2022-08-12 DIAGNOSIS — E876 Hypokalemia: Secondary | ICD-10-CM | POA: Insufficient documentation

## 2022-08-12 DIAGNOSIS — Z923 Personal history of irradiation: Secondary | ICD-10-CM | POA: Insufficient documentation

## 2022-08-12 DIAGNOSIS — R200 Anesthesia of skin: Secondary | ICD-10-CM | POA: Diagnosis not present

## 2022-08-12 DIAGNOSIS — Z79630 Long term (current) use of alkylating agent: Secondary | ICD-10-CM | POA: Diagnosis not present

## 2022-08-12 DIAGNOSIS — E860 Dehydration: Secondary | ICD-10-CM | POA: Insufficient documentation

## 2022-08-12 LAB — RAD ONC ARIA SESSION SUMMARY
Course Elapsed Days: 39
Plan Fractions Treated to Date: 10
Plan Prescribed Dose Per Fraction: 2 Gy
Plan Total Fractions Prescribed: 18
Plan Total Prescribed Dose: 36 Gy
Reference Point Dosage Given to Date: 54 Gy
Reference Point Session Dosage Given: 2 Gy
Session Number: 27

## 2022-08-13 ENCOUNTER — Ambulatory Visit
Admission: RE | Admit: 2022-08-13 | Discharge: 2022-08-13 | Disposition: A | Discharge status: Home / Self Care | Payer: Medicare Other | Source: Ambulatory Visit | Attending: Radiation Oncology | Admitting: Radiation Oncology

## 2022-08-13 ENCOUNTER — Other Ambulatory Visit: Payer: Self-pay

## 2022-08-13 ENCOUNTER — Inpatient Hospital Stay: Payer: Medicare Other | Attending: Oncology

## 2022-08-13 VITALS — BP 155/80 | HR 64 | Temp 95.3°F | Resp 20

## 2022-08-13 DIAGNOSIS — R202 Paresthesia of skin: Secondary | ICD-10-CM | POA: Insufficient documentation

## 2022-08-13 DIAGNOSIS — K219 Gastro-esophageal reflux disease without esophagitis: Secondary | ICD-10-CM | POA: Insufficient documentation

## 2022-08-13 DIAGNOSIS — Z79630 Long term (current) use of alkylating agent: Secondary | ICD-10-CM | POA: Insufficient documentation

## 2022-08-13 DIAGNOSIS — Z510 Encounter for antineoplastic radiation therapy: Secondary | ICD-10-CM | POA: Diagnosis not present

## 2022-08-13 DIAGNOSIS — K208 Other esophagitis without bleeding: Secondary | ICD-10-CM | POA: Insufficient documentation

## 2022-08-13 DIAGNOSIS — D709 Neutropenia, unspecified: Secondary | ICD-10-CM | POA: Insufficient documentation

## 2022-08-13 DIAGNOSIS — T451X5A Adverse effect of antineoplastic and immunosuppressive drugs, initial encounter: Secondary | ICD-10-CM | POA: Insufficient documentation

## 2022-08-13 DIAGNOSIS — Z87891 Personal history of nicotine dependence: Secondary | ICD-10-CM | POA: Insufficient documentation

## 2022-08-13 DIAGNOSIS — Z8619 Personal history of other infectious and parasitic diseases: Secondary | ICD-10-CM | POA: Insufficient documentation

## 2022-08-13 DIAGNOSIS — G620 Drug-induced polyneuropathy: Secondary | ICD-10-CM | POA: Insufficient documentation

## 2022-08-13 DIAGNOSIS — I700 Atherosclerosis of aorta: Secondary | ICD-10-CM | POA: Insufficient documentation

## 2022-08-13 DIAGNOSIS — Z806 Family history of leukemia: Secondary | ICD-10-CM | POA: Insufficient documentation

## 2022-08-13 DIAGNOSIS — R112 Nausea with vomiting, unspecified: Secondary | ICD-10-CM | POA: Insufficient documentation

## 2022-08-13 DIAGNOSIS — C099 Malignant neoplasm of tonsil, unspecified: Secondary | ICD-10-CM | POA: Insufficient documentation

## 2022-08-13 DIAGNOSIS — R110 Nausea: Secondary | ICD-10-CM

## 2022-08-13 DIAGNOSIS — R197 Diarrhea, unspecified: Secondary | ICD-10-CM | POA: Insufficient documentation

## 2022-08-13 DIAGNOSIS — N62 Hypertrophy of breast: Secondary | ICD-10-CM | POA: Insufficient documentation

## 2022-08-13 DIAGNOSIS — Z79899 Other long term (current) drug therapy: Secondary | ICD-10-CM | POA: Insufficient documentation

## 2022-08-13 DIAGNOSIS — E876 Hypokalemia: Secondary | ICD-10-CM | POA: Insufficient documentation

## 2022-08-13 DIAGNOSIS — Z5111 Encounter for antineoplastic chemotherapy: Secondary | ICD-10-CM | POA: Insufficient documentation

## 2022-08-13 DIAGNOSIS — R630 Anorexia: Secondary | ICD-10-CM | POA: Insufficient documentation

## 2022-08-13 DIAGNOSIS — Z923 Personal history of irradiation: Secondary | ICD-10-CM | POA: Insufficient documentation

## 2022-08-13 DIAGNOSIS — K1230 Oral mucositis (ulcerative), unspecified: Secondary | ICD-10-CM | POA: Insufficient documentation

## 2022-08-13 DIAGNOSIS — R200 Anesthesia of skin: Secondary | ICD-10-CM | POA: Insufficient documentation

## 2022-08-13 DIAGNOSIS — E781 Pure hyperglyceridemia: Secondary | ICD-10-CM | POA: Insufficient documentation

## 2022-08-13 DIAGNOSIS — D649 Anemia, unspecified: Secondary | ICD-10-CM | POA: Insufficient documentation

## 2022-08-13 DIAGNOSIS — E860 Dehydration: Secondary | ICD-10-CM | POA: Insufficient documentation

## 2022-08-13 DIAGNOSIS — N644 Mastodynia: Secondary | ICD-10-CM | POA: Insufficient documentation

## 2022-08-13 DIAGNOSIS — Z87442 Personal history of urinary calculi: Secondary | ICD-10-CM | POA: Insufficient documentation

## 2022-08-13 LAB — RAD ONC ARIA SESSION SUMMARY
Course Elapsed Days: 40
Plan Fractions Treated to Date: 11
Plan Prescribed Dose Per Fraction: 2 Gy
Plan Total Fractions Prescribed: 18
Plan Total Prescribed Dose: 36 Gy
Reference Point Dosage Given to Date: 56 Gy
Reference Point Session Dosage Given: 2 Gy
Session Number: 28

## 2022-08-13 MED ORDER — SODIUM CHLORIDE 0.9% FLUSH
10.0000 mL | Freq: Once | INTRAVENOUS | Status: AC
  Administered 2022-08-13: 10 mL via INTRAVENOUS
  Filled 2022-08-13: qty 10

## 2022-08-13 MED ORDER — ONDANSETRON HCL 4 MG/2ML IJ SOLN: 8.0000 mg | Freq: Once | INTRAMUSCULAR | Status: DC

## 2022-08-13 MED ORDER — HEPARIN SOD (PORK) LOCK FLUSH 100 UNIT/ML IV SOLN
500.0000 [IU] | Freq: Once | INTRAVENOUS | Status: AC
  Administered 2022-08-13: 500 [IU] via INTRAVENOUS
  Filled 2022-08-13: qty 5

## 2022-08-13 MED ORDER — SODIUM CHLORIDE 0.9 % IV SOLN
Freq: Once | INTRAVENOUS | Status: AC
  Filled 2022-08-13: qty 250

## 2022-08-14 ENCOUNTER — Ambulatory Visit
Admission: RE | Admit: 2022-08-14 | Discharge: 2022-08-14 | Disposition: A | Discharge status: Home / Self Care | Payer: Medicare Other | Source: Ambulatory Visit | Attending: Radiation Oncology | Admitting: Radiation Oncology

## 2022-08-14 ENCOUNTER — Other Ambulatory Visit: Payer: Self-pay

## 2022-08-14 DIAGNOSIS — Z510 Encounter for antineoplastic radiation therapy: Secondary | ICD-10-CM | POA: Diagnosis not present

## 2022-08-14 LAB — RAD ONC ARIA SESSION SUMMARY
Course Elapsed Days: 41
Plan Fractions Treated to Date: 12
Plan Prescribed Dose Per Fraction: 2 Gy
Plan Total Fractions Prescribed: 18
Plan Total Prescribed Dose: 36 Gy
Reference Point Dosage Given to Date: 58 Gy
Reference Point Session Dosage Given: 2 Gy
Session Number: 29

## 2022-08-14 MED FILL — Dexamethasone Sodium Phosphate Inj 100 MG/10ML: INTRAMUSCULAR | Qty: 1 | Status: AC

## 2022-08-14 MED FILL — Fosaprepitant Dimeglumine For IV Infusion 150 MG (Base Eq): INTRAVENOUS | Qty: 5 | Status: AC

## 2022-08-16 ENCOUNTER — Inpatient Hospital Stay (HOSPITAL_BASED_OUTPATIENT_CLINIC_OR_DEPARTMENT_OTHER): Payer: Medicare Other | Admitting: Oncology

## 2022-08-16 ENCOUNTER — Ambulatory Visit: Admission: RE | Admit: 2022-08-16 | Payer: Medicare Other | Source: Ambulatory Visit

## 2022-08-16 ENCOUNTER — Other Ambulatory Visit: Payer: Self-pay

## 2022-08-16 ENCOUNTER — Inpatient Hospital Stay: Payer: Medicare Other

## 2022-08-16 ENCOUNTER — Encounter: Payer: Self-pay | Admitting: Oncology

## 2022-08-16 VITALS — BP 136/78 | HR 84 | Temp 97.8°F | Resp 18 | Wt 146.3 lb

## 2022-08-16 DIAGNOSIS — C099 Malignant neoplasm of tonsil, unspecified: Secondary | ICD-10-CM

## 2022-08-16 DIAGNOSIS — Z5111 Encounter for antineoplastic chemotherapy: Secondary | ICD-10-CM | POA: Diagnosis not present

## 2022-08-16 DIAGNOSIS — G620 Drug-induced polyneuropathy: Secondary | ICD-10-CM | POA: Diagnosis not present

## 2022-08-16 DIAGNOSIS — R110 Nausea: Secondary | ICD-10-CM

## 2022-08-16 DIAGNOSIS — D649 Anemia, unspecified: Secondary | ICD-10-CM | POA: Diagnosis not present

## 2022-08-16 DIAGNOSIS — T451X5A Adverse effect of antineoplastic and immunosuppressive drugs, initial encounter: Secondary | ICD-10-CM

## 2022-08-16 DIAGNOSIS — E876 Hypokalemia: Secondary | ICD-10-CM

## 2022-08-16 DIAGNOSIS — K1230 Oral mucositis (ulcerative), unspecified: Secondary | ICD-10-CM

## 2022-08-16 DIAGNOSIS — Z510 Encounter for antineoplastic radiation therapy: Secondary | ICD-10-CM | POA: Diagnosis not present

## 2022-08-16 LAB — CMP (CANCER CENTER ONLY)
ALT: 38 U/L (ref 0–44)
AST: 39 U/L (ref 15–41)
Albumin: 3.9 g/dL (ref 3.5–5.0)
Alkaline Phosphatase: 63 U/L (ref 38–126)
Anion gap: 16 — ABNORMAL HIGH (ref 5–15)
BUN: 24 mg/dL — ABNORMAL HIGH (ref 8–23)
CO2: 24 mmol/L (ref 22–32)
Calcium: 9.3 mg/dL (ref 8.9–10.3)
Chloride: 97 mmol/L — ABNORMAL LOW (ref 98–111)
Creatinine: 1.24 mg/dL (ref 0.61–1.24)
GFR, Estimated: >60 mL/min (ref >60)
Glucose, Bld: 130 mg/dL — ABNORMAL HIGH (ref 70–99)
Potassium: 3.1 mmol/L — ABNORMAL LOW (ref 3.5–5.1)
Sodium: 137 mmol/L (ref 135–145)
Total Bilirubin: 0.7 mg/dL (ref 0.3–1.2)
Total Protein: 6.9 g/dL (ref 6.5–8.1)

## 2022-08-16 LAB — CBC WITH DIFFERENTIAL (CANCER CENTER ONLY)
Abs Immature Granulocytes: 0.01 10*3/uL (ref 0.00–0.07)
Basophils Absolute: 0 10*3/uL (ref 0.0–0.1)
Basophils Relative: 0 %
Eosinophils Absolute: 0 10*3/uL (ref 0.0–0.5)
Eosinophils Relative: 0 %
HCT: 33.4 % — ABNORMAL LOW (ref 39.0–52.0)
Hemoglobin: 11.3 g/dL — ABNORMAL LOW (ref 13.0–17.0)
Immature Granulocytes: 1 %
Lymphocytes Relative: 29 %
Lymphs Abs: 0.6 10*3/uL — ABNORMAL LOW (ref 0.7–4.0)
MCH: 26.2 pg (ref 26.0–34.0)
MCHC: 33.8 g/dL (ref 30.0–36.0)
MCV: 77.5 fL — ABNORMAL LOW (ref 80.0–100.0)
Monocytes Absolute: 0.3 10*3/uL (ref 0.1–1.0)
Monocytes Relative: 14 %
Neutro Abs: 1.1 10*3/uL — ABNORMAL LOW (ref 1.7–7.7)
Neutrophils Relative %: 56 %
Platelet Count: 153 10*3/uL (ref 150–400)
RBC: 4.31 MIL/uL (ref 4.22–5.81)
RDW: 15.6 % — ABNORMAL HIGH (ref 11.5–15.5)
WBC Count: 2 10*3/uL — ABNORMAL LOW (ref 4.0–10.5)
nRBC: 0 % (ref 0.0–0.2)

## 2022-08-16 LAB — MAGNESIUM: Magnesium: 1.7 mg/dL (ref 1.7–2.4)

## 2022-08-16 LAB — RAD ONC ARIA SESSION SUMMARY
Course Elapsed Days: 43
Plan Fractions Treated to Date: 13
Plan Prescribed Dose Per Fraction: 2 Gy
Plan Total Fractions Prescribed: 18
Plan Total Prescribed Dose: 36 Gy
Reference Point Dosage Given to Date: 60 Gy
Reference Point Session Dosage Given: 2 Gy
Session Number: 30

## 2022-08-16 MED ORDER — POTASSIUM CHLORIDE IN NACL 20-0.9 MEQ/L-% IV SOLN
Freq: Once | INTRAVENOUS | Status: AC
  Filled 2022-08-16: qty 1000

## 2022-08-16 MED ORDER — SODIUM CHLORIDE 0.9 % IV SOLN
150.0000 mg | Freq: Once | INTRAVENOUS | Status: AC
  Administered 2022-08-16: 150 mg via INTRAVENOUS
  Filled 2022-08-16: qty 150

## 2022-08-16 MED ORDER — SODIUM CHLORIDE 0.9 % IV SOLN
10.0000 mg | Freq: Once | INTRAVENOUS | Status: AC
  Administered 2022-08-16: 10 mg via INTRAVENOUS
  Filled 2022-08-16: qty 10

## 2022-08-16 MED ORDER — PALONOSETRON HCL INJECTION 0.25 MG/5ML
0.2500 mg | Freq: Once | INTRAVENOUS | Status: AC
  Administered 2022-08-16: 0.25 mg via INTRAVENOUS
  Filled 2022-08-16: qty 5

## 2022-08-16 MED ORDER — POTASSIUM CHLORIDE 20 MEQ/100ML IV SOLN
20.0000 meq | Freq: Once | INTRAVENOUS | Status: AC
  Administered 2022-08-16: 20 meq via INTRAVENOUS

## 2022-08-16 MED ORDER — ONDANSETRON 8 MG PO TBDP: 8.0000 mg | ORAL_TABLET | Freq: Three times a day (TID) | ORAL | 1 refills | Status: DC | PRN

## 2022-08-16 MED ORDER — MAGNESIUM SULFATE 2 GM/50ML IV SOLN
2.0000 g | Freq: Once | INTRAVENOUS | Status: AC
  Administered 2022-08-16: 2 g via INTRAVENOUS
  Filled 2022-08-16: qty 50

## 2022-08-16 MED ORDER — POTASSIUM CHLORIDE CRYS ER 20 MEQ PO TBCR: 20.0000 meq | EXTENDED_RELEASE_TABLET | Freq: Every day | ORAL | 0 refills | Status: DC

## 2022-08-16 MED ORDER — SODIUM CHLORIDE 0.9 % IV SOLN
Freq: Once | INTRAVENOUS | Status: AC
  Filled 2022-08-16: qty 250

## 2022-08-16 MED ORDER — HEPARIN SOD (PORK) LOCK FLUSH 100 UNIT/ML IV SOLN
500.0000 [IU] | Freq: Once | INTRAVENOUS | Status: AC | PRN
  Administered 2022-08-16: 500 [IU]
  Filled 2022-08-16: qty 5

## 2022-08-16 MED ORDER — SODIUM CHLORIDE 0.9 % IV SOLN
40.0000 mg/m2 | Freq: Once | INTRAVENOUS | Status: AC
  Administered 2022-08-16: 72 mg via INTRAVENOUS
  Filled 2022-08-16: qty 72

## 2022-08-16 NOTE — Assessment & Plan Note (Signed)
Chronic, stable.  Continue monitor. 

## 2022-08-16 NOTE — Assessment & Plan Note (Addendum)
Recommend patient to utilize his antiemetics.  Switch to Zofran ODT Ativan 0.5 mg every 12 hours as needed for anxiety/sleep/nausea vomiting.   Acid reflux, recommend omeprazole 20mg  daily. He uses otc supplies

## 2022-08-16 NOTE — Assessment & Plan Note (Signed)
Chemotherapy plan as listed above 

## 2022-08-16 NOTE — Assessment & Plan Note (Signed)
Grade 1 Observation. Keep feet warm.  

## 2022-08-16 NOTE — Patient Instructions (Signed)
[facility] [place] [facility]  Discharge Instructions: Thank you for choosing [facility] to provide your oncology and hematology care.  If you have a lab appointment with the Cancer Center, please go directly to the Cancer Center and check in at the registration area.  Wear comfortable clothing and clothing appropriate for easy access to any Portacath or PICC line.   We strive to give you quality time with your provider. You may need to reschedule your appointment if you arrive late (15 or more minutes).  Arriving late affects you and other patients whose appointments are after yours.  Also, if you miss three or more appointments without notifying the office, you may be dismissed from the clinic at the provider's discretion.      For prescription refill requests, have your pharmacy contact our office and allow 72 hours for refills to be completed.     To help prevent nausea and vomiting after your treatment, we encourage you to take your nausea medication as directed.  BELOW ARE SYMPTOMS THAT SHOULD BE REPORTED IMMEDIATELY: *FEVER GREATER THAN 100.4 F (38 C) OR HIGHER *CHILLS OR SWEATING *NAUSEA AND VOMITING THAT IS NOT CONTROLLED WITH YOUR NAUSEA MEDICATION *UNUSUAL SHORTNESS OF BREATH *UNUSUAL BRUISING OR BLEEDING *URINARY PROBLEMS (pain or burning when urinating, or frequent urination) *BOWEL PROBLEMS (unusual diarrhea, constipation, pain near the anus) TENDERNESS IN MOUTH AND THROAT WITH OR WITHOUT PRESENCE OF ULCERS (sore throat, sores in mouth, or a toothache) UNUSUAL RASH, SWELLING OR PAIN  UNUSUAL VAGINAL DISCHARGE OR ITCHING   Items with * indicate a potential emergency and should be followed up as soon as possible or go to the Emergency Department if any problems should occur.  Please show the CHEMOTHERAPY ALERT CARD or IMMUNOTHERAPY ALERT CARD at check-in to the Emergency Department and triage nurse.  Should you have questions after your visit  or need to cancel or reschedule your appointment, please contact [facility] [place] [facility]  [phone] and follow the prompts.  Office hours are 8:00 a.m. to 4:30 p.m. Monday - Friday. Please note that voicemails left after 4:00 p.m. may not be returned until the following business day.  We are closed weekends and major holidays. You have access to a nurse at all times for urgent questions. Please call the main number to the clinic [phone] and follow the prompts.  For any non-urgent questions, you may also contact your provider using MyChart. We now offer e-Visits for anyone 18 and older to request care online for non-urgent symptoms. For details visit [email or web address].   Also download the MyChart app! Go to the app store, search "MyChart", open the app, select [facility], and log in with your MyChart username and password.    

## 2022-08-16 NOTE — Assessment & Plan Note (Signed)
Encourage patient to utilize magic mouth wash.  

## 2022-08-16 NOTE — Assessment & Plan Note (Signed)
Biopsy and PET scan results were reviewed and discussed with patient. 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  Pending ENT evaluation. P16 positive. Recommend concurrent chemotherapy cisplatin and radiation Labs are reviewed and discussed with patient. Proceed with cisplatin 40mg/m2  IV fluid hydration session PRN Follow-up with radiation oncology for radiation. 
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
## 2022-08-19 ENCOUNTER — Ambulatory Visit
Admission: RE | Admit: 2022-08-19 | Discharge: 2022-08-19 | Disposition: A | Discharge status: Home / Self Care | Payer: Medicare Other | Source: Ambulatory Visit | Attending: Radiation Oncology | Admitting: Radiation Oncology

## 2022-08-19 ENCOUNTER — Other Ambulatory Visit: Payer: Self-pay

## 2022-08-19 DIAGNOSIS — Z510 Encounter for antineoplastic radiation therapy: Secondary | ICD-10-CM | POA: Diagnosis not present

## 2022-08-19 LAB — RAD ONC ARIA SESSION SUMMARY
Course Elapsed Days: 46
Plan Fractions Treated to Date: 14
Plan Prescribed Dose Per Fraction: 2 Gy
Plan Total Fractions Prescribed: 18
Plan Total Prescribed Dose: 36 Gy
Reference Point Dosage Given to Date: 62 Gy
Reference Point Session Dosage Given: 2 Gy
Session Number: 31

## 2022-08-20 ENCOUNTER — Other Ambulatory Visit: Payer: Self-pay | Admitting: Oncology

## 2022-08-20 ENCOUNTER — Inpatient Hospital Stay: Payer: Medicare Other

## 2022-08-20 ENCOUNTER — Other Ambulatory Visit: Payer: Self-pay

## 2022-08-20 ENCOUNTER — Ambulatory Visit
Admission: RE | Admit: 2022-08-20 | Discharge: 2022-08-20 | Disposition: A | Discharge status: Home / Self Care | Payer: Medicare Other | Source: Ambulatory Visit | Attending: Radiation Oncology | Admitting: Radiation Oncology

## 2022-08-20 VITALS — BP 142/87 | HR 69 | Temp 96.1°F | Resp 20

## 2022-08-20 DIAGNOSIS — C099 Malignant neoplasm of tonsil, unspecified: Secondary | ICD-10-CM

## 2022-08-20 DIAGNOSIS — E860 Dehydration: Secondary | ICD-10-CM

## 2022-08-20 DIAGNOSIS — Z510 Encounter for antineoplastic radiation therapy: Secondary | ICD-10-CM | POA: Diagnosis not present

## 2022-08-20 DIAGNOSIS — T451X5A Adverse effect of antineoplastic and immunosuppressive drugs, initial encounter: Secondary | ICD-10-CM

## 2022-08-20 LAB — RAD ONC ARIA SESSION SUMMARY
Course Elapsed Days: 47
Plan Fractions Treated to Date: 15
Plan Prescribed Dose Per Fraction: 2 Gy
Plan Total Fractions Prescribed: 18
Plan Total Prescribed Dose: 36 Gy
Reference Point Dosage Given to Date: 64 Gy
Reference Point Session Dosage Given: 2 Gy
Session Number: 32

## 2022-08-20 MED ORDER — HEPARIN SOD (PORK) LOCK FLUSH 100 UNIT/ML IV SOLN
500.0000 [IU] | Freq: Once | INTRAVENOUS | Status: AC | PRN
  Administered 2022-08-20: 500 [IU]
  Filled 2022-08-20: qty 5

## 2022-08-20 MED ORDER — SODIUM CHLORIDE 0.9 % IV SOLN: Freq: Once | INTRAVENOUS | Status: DC

## 2022-08-20 MED ORDER — SODIUM CHLORIDE 0.9 % IV SOLN
INTRAVENOUS | Status: DC
  Filled 2022-08-20 (×2): qty 250

## 2022-08-20 MED ORDER — ONDANSETRON HCL 4 MG/2ML IJ SOLN
8.0000 mg | Freq: Once | INTRAMUSCULAR | Status: AC | PRN
  Administered 2022-08-20: 8 mg via INTRAVENOUS
  Filled 2022-08-20: qty 4

## 2022-08-20 MED ORDER — SODIUM CHLORIDE 0.9% FLUSH
10.0000 mL | Freq: Once | INTRAVENOUS | Status: AC | PRN
  Administered 2022-08-20: 10 mL
  Filled 2022-08-20: qty 10

## 2022-08-21 ENCOUNTER — Other Ambulatory Visit: Payer: Self-pay

## 2022-08-21 ENCOUNTER — Inpatient Hospital Stay: Payer: Medicare Other

## 2022-08-21 ENCOUNTER — Ambulatory Visit
Admission: RE | Admit: 2022-08-21 | Discharge: 2022-08-21 | Disposition: A | Discharge status: Home / Self Care | Payer: Medicare Other | Source: Ambulatory Visit | Attending: Radiation Oncology | Admitting: Radiation Oncology

## 2022-08-21 ENCOUNTER — Inpatient Hospital Stay (HOSPITAL_BASED_OUTPATIENT_CLINIC_OR_DEPARTMENT_OTHER): Payer: Medicare Other | Admitting: Hospice and Palliative Medicine

## 2022-08-21 ENCOUNTER — Encounter: Payer: Self-pay | Admitting: Hospice and Palliative Medicine

## 2022-08-21 VITALS — BP 152/87 | HR 95

## 2022-08-21 DIAGNOSIS — T451X5A Adverse effect of antineoplastic and immunosuppressive drugs, initial encounter: Secondary | ICD-10-CM

## 2022-08-21 DIAGNOSIS — E860 Dehydration: Secondary | ICD-10-CM

## 2022-08-21 DIAGNOSIS — C099 Malignant neoplasm of tonsil, unspecified: Secondary | ICD-10-CM

## 2022-08-21 DIAGNOSIS — Z510 Encounter for antineoplastic radiation therapy: Secondary | ICD-10-CM | POA: Diagnosis not present

## 2022-08-21 DIAGNOSIS — E876 Hypokalemia: Secondary | ICD-10-CM

## 2022-08-21 DIAGNOSIS — R110 Nausea: Secondary | ICD-10-CM

## 2022-08-21 LAB — CBC (CANCER CENTER ONLY)
HCT: 32.7 % — ABNORMAL LOW (ref 39.0–52.0)
Hemoglobin: 11.2 g/dL — ABNORMAL LOW (ref 13.0–17.0)
MCH: 26.6 pg (ref 26.0–34.0)
MCHC: 34.3 g/dL (ref 30.0–36.0)
MCV: 77.7 fL — ABNORMAL LOW (ref 80.0–100.0)
Platelet Count: 164 10*3/uL (ref 150–400)
RBC: 4.21 MIL/uL — ABNORMAL LOW (ref 4.22–5.81)
RDW: 16.3 % — ABNORMAL HIGH (ref 11.5–15.5)
WBC Count: 2.1 10*3/uL — ABNORMAL LOW (ref 4.0–10.5)
nRBC: 0 % (ref 0.0–0.2)

## 2022-08-21 LAB — RAD ONC ARIA SESSION SUMMARY
Course Elapsed Days: 48
Plan Fractions Treated to Date: 16
Plan Prescribed Dose Per Fraction: 2 Gy
Plan Total Fractions Prescribed: 18
Plan Total Prescribed Dose: 36 Gy
Reference Point Dosage Given to Date: 66 Gy
Reference Point Session Dosage Given: 2 Gy
Session Number: 33

## 2022-08-21 LAB — CMP (CANCER CENTER ONLY)
ALT: 31 U/L (ref 0–44)
AST: 26 U/L (ref 15–41)
Albumin: 3.6 g/dL (ref 3.5–5.0)
Alkaline Phosphatase: 58 U/L (ref 38–126)
Anion gap: 18 — ABNORMAL HIGH (ref 5–15)
BUN: 23 mg/dL (ref 8–23)
CO2: 20 mmol/L — ABNORMAL LOW (ref 22–32)
Calcium: 8.6 mg/dL — ABNORMAL LOW (ref 8.9–10.3)
Chloride: 103 mmol/L (ref 98–111)
Creatinine: 0.93 mg/dL (ref 0.61–1.24)
GFR, Estimated: >60 mL/min (ref >60)
Glucose, Bld: 99 mg/dL (ref 70–99)
Potassium: 2.8 mmol/L — ABNORMAL LOW (ref 3.5–5.1)
Sodium: 141 mmol/L (ref 135–145)
Total Bilirubin: 0.9 mg/dL (ref 0.3–1.2)
Total Protein: 6.1 g/dL — ABNORMAL LOW (ref 6.5–8.1)

## 2022-08-21 LAB — MAGNESIUM: Magnesium: 1.5 mg/dL — ABNORMAL LOW (ref 1.7–2.4)

## 2022-08-21 MED ORDER — OLANZAPINE 10 MG PO TBDP: 10.0000 mg | ORAL_TABLET | Freq: Every day | ORAL | 0 refills | Status: DC

## 2022-08-21 MED ORDER — MAGNESIUM SULFATE 2 GM/50ML IV SOLN
2.0000 g | Freq: Once | INTRAVENOUS | Status: AC
  Administered 2022-08-21: 2 g via INTRAVENOUS
  Filled 2022-08-21: qty 50

## 2022-08-21 MED ORDER — POTASSIUM CHLORIDE CRYS ER 20 MEQ PO TBCR: 20.0000 meq | EXTENDED_RELEASE_TABLET | Freq: Every day | ORAL | 0 refills | Status: DC

## 2022-08-21 MED ORDER — SODIUM CHLORIDE 0.9% FLUSH
10.0000 mL | Freq: Once | INTRAVENOUS | Status: AC | PRN
  Administered 2022-08-21: 10 mL
  Filled 2022-08-21: qty 10

## 2022-08-21 MED ORDER — POTASSIUM CHLORIDE 20 MEQ/100ML IV SOLN
20.0000 meq | Freq: Once | INTRAVENOUS | Status: AC
  Administered 2022-08-21: 20 meq via INTRAVENOUS

## 2022-08-21 MED ORDER — ONDANSETRON HCL 4 MG/2ML IJ SOLN
8.0000 mg | Freq: Once | INTRAMUSCULAR | Status: AC
  Administered 2022-08-21: 8 mg via INTRAVENOUS
  Filled 2022-08-21: qty 4

## 2022-08-21 MED ORDER — SODIUM CHLORIDE 0.9 % IV SOLN
INTRAVENOUS | Status: DC
  Filled 2022-08-21 (×2): qty 250

## 2022-08-21 MED ORDER — SODIUM CHLORIDE 0.9 % IV SOLN: Freq: Once | INTRAVENOUS | Status: DC

## 2022-08-21 NOTE — Progress Notes (Signed)
Patient here for radiation "not eaten much in a week and has been throwing up". Fluid apt added. Zofran 8mg  ordered & given. Office visit added. Curtis Cooper will see patient

## 2022-08-21 NOTE — Progress Notes (Signed)
Patient here for radiation "not eaten much in a week and has been throwing up". Fluid apt added. Zofran 8mg  ordered & given

## 2022-08-21 NOTE — Progress Notes (Signed)
K 2.8 and Mg 1.5 today. NP ordered 2g MG and 20 K IV. Will repeat labs tomorrow and possible replacement. Patient aware
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
## 2022-08-21 NOTE — Patient Instructions (Signed)
[facility]  Discharge Instructions: Thank you for choosing [facility] to provide your oncology and hematology care.  If you have a lab appointment with the Cancer Center, please go directly to the Cancer Center and check in at the registration area.  Wear comfortable clothing and clothing appropriate for easy access to any Portacath or PICC line.   We strive to give you quality time with your provider. You may need to reschedule your appointment if you arrive late (15 or more minutes).  Arriving late affects you and other patients whose appointments are after yours.  Also, if you miss three or more appointments without notifying the office, you may be dismissed from the clinic at the provider's discretion.      For prescription refill requests, have your pharmacy contact our office and allow 72 hours for refills to be completed.    Today you received IV fluids    To help prevent nausea and vomiting after your treatment, we encourage you to take your nausea medication as directed.  BELOW ARE SYMPTOMS THAT SHOULD BE REPORTED IMMEDIATELY: *FEVER GREATER THAN 100.4 F (38 C) OR HIGHER *CHILLS OR SWEATING *NAUSEA AND VOMITING THAT IS NOT CONTROLLED WITH YOUR NAUSEA MEDICATION *UNUSUAL SHORTNESS OF BREATH *UNUSUAL BRUISING OR BLEEDING *URINARY PROBLEMS (pain or burning when urinating, or frequent urination) *BOWEL PROBLEMS (unusual diarrhea, constipation, pain near the anus) TENDERNESS IN MOUTH AND THROAT WITH OR WITHOUT PRESENCE OF ULCERS (sore throat, sores in mouth, or a toothache) UNUSUAL RASH, SWELLING OR PAIN  UNUSUAL VAGINAL DISCHARGE OR ITCHING   Items with * indicate a potential emergency and should be followed up as soon as possible or go to the Emergency Department if any problems should occur.  Please show the CHEMOTHERAPY ALERT CARD or IMMUNOTHERAPY ALERT CARD at check-in to the Emergency Department and triage nurse.  Should you  have questions after your visit or need to cancel or reschedule your appointment, please contact [facility]  [phone] and follow the prompts.  Office hours are 8:00 a.m. to 4:30 p.m. Monday - Friday. Please note that voicemails left after 4:00 p.m. may not be returned until the following business day.  We are closed weekends and major holidays. You have access to a nurse at all times for urgent questions. Please call the main number to the clinic [phone] and follow the prompts.  For any non-urgent questions, you may also contact your provider using MyChart. We now offer e-Visits for anyone 18 and older to request care online for non-urgent symptoms. For details visit [email or web address].   Also download the MyChart app! Go to the app store, search "MyChart", open the app, select [facility], and log in with your MyChart username and password.    

## 2022-08-22 ENCOUNTER — Inpatient Hospital Stay: Payer: Medicare Other

## 2022-08-22 ENCOUNTER — Ambulatory Visit
Admission: RE | Admit: 2022-08-22 | Discharge: 2022-08-22 | Disposition: A | Discharge status: Home / Self Care | Payer: Medicare Other | Source: Ambulatory Visit | Attending: Radiation Oncology | Admitting: Radiation Oncology

## 2022-08-22 ENCOUNTER — Other Ambulatory Visit: Payer: Self-pay

## 2022-08-22 VITALS — BP 148/78 | HR 66

## 2022-08-22 DIAGNOSIS — Z510 Encounter for antineoplastic radiation therapy: Secondary | ICD-10-CM | POA: Diagnosis not present

## 2022-08-22 DIAGNOSIS — T451X5A Adverse effect of antineoplastic and immunosuppressive drugs, initial encounter: Secondary | ICD-10-CM

## 2022-08-22 DIAGNOSIS — C099 Malignant neoplasm of tonsil, unspecified: Secondary | ICD-10-CM

## 2022-08-22 DIAGNOSIS — E860 Dehydration: Secondary | ICD-10-CM

## 2022-08-22 LAB — CBC (CANCER CENTER ONLY)
HCT: 33.7 % — ABNORMAL LOW (ref 39.0–52.0)
Hemoglobin: 11.4 g/dL — ABNORMAL LOW (ref 13.0–17.0)
MCH: 26.1 pg (ref 26.0–34.0)
MCHC: 33.8 g/dL (ref 30.0–36.0)
MCV: 77.3 fL — ABNORMAL LOW (ref 80.0–100.0)
Platelet Count: 169 10*3/uL (ref 150–400)
RBC: 4.36 MIL/uL (ref 4.22–5.81)
RDW: 16 % — ABNORMAL HIGH (ref 11.5–15.5)
WBC Count: 2.4 10*3/uL — ABNORMAL LOW (ref 4.0–10.5)
nRBC: 0 % (ref 0.0–0.2)

## 2022-08-22 LAB — RAD ONC ARIA SESSION SUMMARY
Course Elapsed Days: 49
Plan Fractions Treated to Date: 17
Plan Prescribed Dose Per Fraction: 2 Gy
Plan Total Fractions Prescribed: 18
Plan Total Prescribed Dose: 36 Gy
Reference Point Dosage Given to Date: 68 Gy
Reference Point Session Dosage Given: 2 Gy
Session Number: 34

## 2022-08-22 LAB — CMP (CANCER CENTER ONLY)
ALT: 32 U/L (ref 0–44)
AST: 27 U/L (ref 15–41)
Albumin: 3.9 g/dL (ref 3.5–5.0)
Alkaline Phosphatase: 63 U/L (ref 38–126)
Anion gap: 19 — ABNORMAL HIGH (ref 5–15)
BUN: 20 mg/dL (ref 8–23)
CO2: 21 mmol/L — ABNORMAL LOW (ref 22–32)
Calcium: 9.3 mg/dL (ref 8.9–10.3)
Chloride: 100 mmol/L (ref 98–111)
Creatinine: 1.06 mg/dL (ref 0.61–1.24)
GFR, Estimated: >60 mL/min (ref >60)
Glucose, Bld: 114 mg/dL — ABNORMAL HIGH (ref 70–99)
Potassium: 3.1 mmol/L — ABNORMAL LOW (ref 3.5–5.1)
Sodium: 140 mmol/L (ref 135–145)
Total Bilirubin: 0.8 mg/dL (ref 0.3–1.2)
Total Protein: 6.8 g/dL (ref 6.5–8.1)

## 2022-08-22 LAB — MAGNESIUM: Magnesium: 1.8 mg/dL (ref 1.7–2.4)

## 2022-08-22 MED ORDER — SODIUM CHLORIDE 0.9 % IV SOLN
INTRAVENOUS | Status: DC
  Filled 2022-08-22 (×2): qty 250

## 2022-08-22 MED ORDER — ONDANSETRON HCL 4 MG/2ML IJ SOLN
8.0000 mg | Freq: Once | INTRAMUSCULAR | Status: AC
  Administered 2022-08-22: 8 mg via INTRAVENOUS
  Filled 2022-08-22: qty 4

## 2022-08-22 MED ORDER — SODIUM CHLORIDE 0.9 % IV SOLN: Freq: Once | INTRAVENOUS | Status: DC

## 2022-08-22 MED ORDER — HEPARIN SOD (PORK) LOCK FLUSH 100 UNIT/ML IV SOLN
500.0000 [IU] | Freq: Once | INTRAVENOUS | Status: AC | PRN
  Administered 2022-08-22: 500 [IU]
  Filled 2022-08-22: qty 5

## 2022-08-22 MED ORDER — POTASSIUM CHLORIDE 20 MEQ/100ML IV SOLN
20.0000 meq | Freq: Once | INTRAVENOUS | Status: AC
  Administered 2022-08-22: 20 meq via INTRAVENOUS

## 2022-08-22 MED ORDER — SODIUM CHLORIDE 0.9% FLUSH
10.0000 mL | Freq: Once | INTRAVENOUS | Status: AC | PRN
  Administered 2022-08-22: 10 mL
  Filled 2022-08-22: qty 10

## 2022-08-22 MED FILL — Dexamethasone Sodium Phosphate Inj 100 MG/10ML: INTRAMUSCULAR | Qty: 1 | Status: AC

## 2022-08-23 ENCOUNTER — Encounter: Payer: Self-pay | Admitting: Oncology

## 2022-08-23 ENCOUNTER — Inpatient Hospital Stay: Payer: Medicare Other

## 2022-08-23 ENCOUNTER — Other Ambulatory Visit: Payer: Self-pay

## 2022-08-23 ENCOUNTER — Inpatient Hospital Stay (HOSPITAL_BASED_OUTPATIENT_CLINIC_OR_DEPARTMENT_OTHER): Payer: Medicare Other | Admitting: Oncology

## 2022-08-23 ENCOUNTER — Ambulatory Visit
Admission: RE | Admit: 2022-08-23 | Discharge: 2022-08-23 | Disposition: A | Discharge status: Home / Self Care | Payer: Medicare Other | Source: Ambulatory Visit | Attending: Radiation Oncology | Admitting: Radiation Oncology

## 2022-08-23 VITALS — BP 150/87 | HR 82 | Temp 95.0°F | Resp 17 | Wt 140.0 lb

## 2022-08-23 DIAGNOSIS — G620 Drug-induced polyneuropathy: Secondary | ICD-10-CM | POA: Diagnosis not present

## 2022-08-23 DIAGNOSIS — C099 Malignant neoplasm of tonsil, unspecified: Secondary | ICD-10-CM | POA: Diagnosis not present

## 2022-08-23 DIAGNOSIS — T451X5A Adverse effect of antineoplastic and immunosuppressive drugs, initial encounter: Secondary | ICD-10-CM

## 2022-08-23 DIAGNOSIS — B370 Candidal stomatitis: Secondary | ICD-10-CM | POA: Insufficient documentation

## 2022-08-23 DIAGNOSIS — D649 Anemia, unspecified: Secondary | ICD-10-CM | POA: Diagnosis not present

## 2022-08-23 DIAGNOSIS — E876 Hypokalemia: Secondary | ICD-10-CM | POA: Insufficient documentation

## 2022-08-23 DIAGNOSIS — E860 Dehydration: Secondary | ICD-10-CM

## 2022-08-23 DIAGNOSIS — R110 Nausea: Secondary | ICD-10-CM

## 2022-08-23 DIAGNOSIS — Z510 Encounter for antineoplastic radiation therapy: Secondary | ICD-10-CM | POA: Diagnosis not present

## 2022-08-23 LAB — CMP (CANCER CENTER ONLY)
ALT: 31 U/L (ref 0–44)
AST: 27 U/L (ref 15–41)
Albumin: 3.8 g/dL (ref 3.5–5.0)
Alkaline Phosphatase: 60 U/L (ref 38–126)
Anion gap: 16 — ABNORMAL HIGH (ref 5–15)
BUN: 19 mg/dL (ref 8–23)
CO2: 22 mmol/L (ref 22–32)
Calcium: 9.3 mg/dL (ref 8.9–10.3)
Chloride: 102 mmol/L (ref 98–111)
Creatinine: 1 mg/dL (ref 0.61–1.24)
GFR, Estimated: >60 mL/min (ref >60)
Glucose, Bld: 105 mg/dL — ABNORMAL HIGH (ref 70–99)
Potassium: 3.1 mmol/L — ABNORMAL LOW (ref 3.5–5.1)
Sodium: 140 mmol/L (ref 135–145)
Total Bilirubin: 1.1 mg/dL (ref 0.3–1.2)
Total Protein: 6.6 g/dL (ref 6.5–8.1)

## 2022-08-23 LAB — CBC WITH DIFFERENTIAL (CANCER CENTER ONLY)
Abs Immature Granulocytes: 0.01 10*3/uL (ref 0.00–0.07)
Basophils Absolute: 0 10*3/uL (ref 0.0–0.1)
Basophils Relative: 0 %
Eosinophils Absolute: 0 10*3/uL (ref 0.0–0.5)
Eosinophils Relative: 0 %
HCT: 32.5 % — ABNORMAL LOW (ref 39.0–52.0)
Hemoglobin: 11.1 g/dL — ABNORMAL LOW (ref 13.0–17.0)
Immature Granulocytes: 0 %
Lymphocytes Relative: 29 %
Lymphs Abs: 0.7 10*3/uL (ref 0.7–4.0)
MCH: 26.5 pg (ref 26.0–34.0)
MCHC: 34.2 g/dL (ref 30.0–36.0)
MCV: 77.6 fL — ABNORMAL LOW (ref 80.0–100.0)
Monocytes Absolute: 0.3 10*3/uL (ref 0.1–1.0)
Monocytes Relative: 11 %
Neutro Abs: 1.4 10*3/uL — ABNORMAL LOW (ref 1.7–7.7)
Neutrophils Relative %: 60 %
Platelet Count: 171 10*3/uL (ref 150–400)
RBC: 4.19 MIL/uL — ABNORMAL LOW (ref 4.22–5.81)
RDW: 16.5 % — ABNORMAL HIGH (ref 11.5–15.5)
WBC Count: 2.3 10*3/uL — ABNORMAL LOW (ref 4.0–10.5)
nRBC: 0 % (ref 0.0–0.2)

## 2022-08-23 LAB — RAD ONC ARIA SESSION SUMMARY
Course Elapsed Days: 50
Plan Fractions Treated to Date: 18
Plan Prescribed Dose Per Fraction: 2 Gy
Plan Total Fractions Prescribed: 18
Plan Total Prescribed Dose: 36 Gy
Reference Point Dosage Given to Date: 70 Gy
Reference Point Session Dosage Given: 2 Gy
Session Number: 35

## 2022-08-23 MED ORDER — PROMETHAZINE HCL 12.5 MG RE SUPP: 12.5000 mg | Freq: Four times a day (QID) | RECTAL | 0 refills | Status: DC | PRN

## 2022-08-23 MED ORDER — NYSTATIN 100000 UNIT/ML MT SUSP: 5.0000 mL | Freq: Four times a day (QID) | OROMUCOSAL | 0 refills | Status: DC

## 2022-08-23 MED ORDER — ONDANSETRON HCL 4 MG/2ML IJ SOLN
8.0000 mg | Freq: Once | INTRAMUSCULAR | Status: AC
  Administered 2022-08-23: 8 mg via INTRAVENOUS
  Filled 2022-08-23: qty 4

## 2022-08-23 MED ORDER — SODIUM CHLORIDE 0.9 % IV SOLN
INTRAVENOUS | Status: DC
  Filled 2022-08-23 (×2): qty 250

## 2022-08-23 MED ORDER — SODIUM CHLORIDE 0.9 % IV SOLN
10.0000 mg | Freq: Once | INTRAVENOUS | Status: AC
  Administered 2022-08-23: 10 mg via INTRAVENOUS
  Filled 2022-08-23: qty 1

## 2022-08-23 MED ORDER — FAMOTIDINE IN NACL 20-0.9 MG/50ML-% IV SOLN
20.0000 mg | Freq: Once | INTRAVENOUS | Status: AC
  Administered 2022-08-23: 20 mg via INTRAVENOUS

## 2022-08-23 MED ORDER — POTASSIUM CHLORIDE 20 MEQ/100ML IV SOLN
20.0000 meq | Freq: Once | INTRAVENOUS | Status: AC
  Administered 2022-08-23: 20 meq via INTRAVENOUS

## 2022-08-23 MED ORDER — HEPARIN SOD (PORK) LOCK FLUSH 100 UNIT/ML IV SOLN
500.0000 [IU] | Freq: Once | INTRAVENOUS | Status: DC | PRN
  Filled 2022-08-23: qty 5

## 2022-08-23 MED ORDER — SODIUM CHLORIDE 0.9 % IV SOLN: Freq: Once | INTRAVENOUS | Status: DC

## 2022-08-23 NOTE — Assessment & Plan Note (Signed)
Chronic, stable.  Continue monitor. 

## 2022-08-23 NOTE — Assessment & Plan Note (Signed)
IV Kcl x 1 today Continue oral KCL daily. Encourage potassium rich food.  Repeat levels next week.

## 2022-08-23 NOTE — Assessment & Plan Note (Signed)
Grade 1 Observation. Keep feet warm.  
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
## 2022-08-23 NOTE — Progress Notes (Signed)
Concerns of Uncontrolled nausea and weight loss

## 2022-08-23 NOTE — Assessment & Plan Note (Signed)
Recommend nystatin mouth wash, Rx sent.

## 2022-08-23 NOTE — Patient Instructions (Signed)
[place] [facility]  Discharge Instructions: Thank you for choosing [facility] to provide your oncology and hematology care.  If you have a lab appointment with the Cancer Center, please go directly to the Cancer Center and check in at the registration area.  Wear comfortable clothing and clothing appropriate for easy access to any Portacath or PICC line.   We strive to give you quality time with your provider. You may need to reschedule your appointment if you arrive late (15 or more minutes).  Arriving late affects you and other patients whose appointments are after yours.  Also, if you miss three or more appointments without notifying the office, you may be dismissed from the clinic at the provider's discretion.      For prescription refill requests, have your pharmacy contact our office and allow 72 hours for refills to be completed.    Today you received the following chemotherapy and/or immunotherapy agents IV FLUIDS, DECADRON, ZOFRAN, POTASSIUM, PEPCID      To help prevent nausea and vomiting after your treatment, we encourage you to take your nausea medication as directed.  BELOW ARE SYMPTOMS THAT SHOULD BE REPORTED IMMEDIATELY: *FEVER GREATER THAN 100.4 F (38 C) OR HIGHER *CHILLS OR SWEATING *NAUSEA AND VOMITING THAT IS NOT CONTROLLED WITH YOUR NAUSEA MEDICATION *UNUSUAL SHORTNESS OF BREATH *UNUSUAL BRUISING OR BLEEDING *URINARY PROBLEMS (pain or burning when urinating, or frequent urination) *BOWEL PROBLEMS (unusual diarrhea, constipation, pain near the anus) TENDERNESS IN MOUTH AND THROAT WITH OR WITHOUT PRESENCE OF ULCERS (sore throat, sores in mouth, or a toothache) UNUSUAL RASH, SWELLING OR PAIN  UNUSUAL VAGINAL DISCHARGE OR ITCHING   Items with * indicate a potential emergency and should be followed up as soon as possible or go to the Emergency Department if any problems should occur.  Please show the CHEMOTHERAPY ALERT CARD or  IMMUNOTHERAPY ALERT CARD at check-in to the Emergency Department and triage nurse.  Should you have questions after your visit or need to cancel or reschedule your appointment, please contact [place] [facility]  [phone] and follow the prompts.  Office hours are 8:00 a.m. to 4:30 p.m. Monday - Friday. Please note that voicemails left after 4:00 p.m. may not be returned until the following business day.  We are closed weekends and major holidays. You have access to a nurse at all times for urgent questions. Please call the main number to the clinic [phone] and follow the prompts.  For any non-urgent questions, you may also contact your provider using MyChart. We now offer e-Visits for anyone 38 and older to request care online for non-urgent symptoms. For details visit [email or web address].   Also download the MyChart app! Go to the app store, search "MyChart", open the app, select [place], and log in with your MyChart username and password.  Famotidine Solution for Injection What is this medication? FAMOTIDINE (fa MOE ti deen) treats stomach ulcers, reflux disease, or other conditions that cause too much stomach acid. It works by reducing the amount of acid in the stomach. This medicine may be used for other purposes; ask your health care provider or pharmacist if you have questions. COMMON BRAND NAME(S): Pepcid What should I tell my care team before I take this medication? They need to know if you have any of these conditions: Kidney or liver disease An unusual or allergic reaction to famotidine, other medications, foods, dyes, or preservatives Pregnant or trying to get pregnant Breast-feeding How  should I use this medication? This medication is for infusion into a vein. It is given in a hospital or clinic setting. Talk to your care team regarding the use of this medication in children. Special care may be needed. Overdosage: If you think you have  taken too much of this medicine contact a poison control center or emergency room at once. NOTE: This medicine is only for you. Do not share this medicine with others. What if I miss a dose? This does not apply. What may interact with this medication? Delavirdine Itraconazole Ketoconazole This list may not describe all possible interactions. Give your health care provider a list of all the medicines, herbs, non-prescription drugs, or dietary supplements you use. Also tell them if you smoke, drink alcohol, or use illegal drugs. Some items may interact with your medicine. What should I watch for while using this medication? Tell your doctor or health care professional if your condition does not start to get better or gets worse. Do not take with aspirin, ibuprofen, or other antiinflammatory medications. These can aggravate your condition. Do not smoke cigarettes or drink alcohol. These increase irritation in your stomach and can increase the time it will take for ulcers to heal. Cigarettes and alcohol can also worsen acid reflux or heartburn. If you get black, tarry stools or vomit up what looks like coffee grounds, call your doctor or health care professional at once. You may have a bleeding ulcer. This medication may cause a decrease in vitamin B12. Make sure that you get enough vitamin B12 while you are taking this medication. Discuss the foods you eat and the vitamins you take with your care team. What side effects may I notice from receiving this medication? Side effects that you should report to your care team as soon as possible: Allergic reactions--skin rash, itching, hives, swelling of the face, lips, tongue, or throat Confusion Hallucinations Side effects that usually do not require medical attention (report to your care team if they continue or are bothersome): Constipation Diarrhea Dizziness Headache This list may not describe all possible side effects. Call your doctor for medical  advice about side effects. You may report side effects to FDA at 1-800-FDA-1088. Where should I keep my medication? This medication is given in a hospital or clinic. You will not be given this medication to store at home. NOTE: This sheet is a summary. It may not cover all possible information. If you have questions about this medicine, talk to your doctor, pharmacist, or health care provider.  2024 Elsevier/Gold Standard (2020-02-01 00:00:00)  Dexamethasone Injection What is this medication? DEXAMETHASONE (dex a METH a sone) treats many conditions such as asthma, allergic reactions, arthritis, inflammatory bowel diseases, adrenal, and blood or bone marrow disorders. It works by decreasing inflammation and slowing down an overactive immune system. It belongs to a group of medications called steroids. This medicine may be used for other purposes; ask your health care provider or pharmacist if you have questions. COMMON BRAND NAME(S): Decadron, DoubleDex, ReadySharp Dexamethasone, Simplist Dexamethasone, Solurex What should I tell my care team before I take this medication? They need to know if you have any of these conditions: Cushing syndrome Diabetes Glaucoma Heart attack Heart disease High blood pressure Infection, such as tuberculosis (TB), bacterial, fungal, or viral infections Kidney disease Liver disease Mental health condition Myasthenia gravis Osteoporosis Seizures Stomach or intestine problems Thyroid disease An unusual or allergic reaction to dexamethasone, lactose, other medications, foods, dyes, or preservatives Pregnant or trying to get  pregnant Breastfeeding How should I use this medication? This medication is injected into a muscle, joint, lesion, or other tissue. It is given by your care team in a hospital or clinic setting. Talk to your care team about the use of this medication in children. Special care may be needed. Overdosage: If you think you have taken too  much of this medicine contact a poison control center or emergency room at once. NOTE: This medicine is only for you. Do not share this medicine with others. What if I miss a dose? This does not apply. What may interact with this medication? Do not take this medication with any of the following: Live virus vaccines This medication may also interact with the following: Aminoglutethimide Amphotericin B Aspirin and aspirin-like medications Certain antibiotics, such as erythromycin, clarithromycin, troleandomycin Certain antivirals for HIV or hepatitis Certain medications for seizures, such as carbamazepine, phenobarbital, phenytoin Certain medications to treat myasthenia gravis Cholestyramine Cyclosporine Digoxin Diuretics Ephedrine Estrogen and progestin hormones Insulin or other medications for diabetes Isoniazid Ketoconazole Medications that relax muscles for surgery Mifepristone NSAIDs, medications for pain and inflammation, such as ibuprofen or naproxen Rifampin Skin tests for allergies Thalidomide Vaccines Warfarin This list may not describe all possible interactions. Give your health care provider a list of all the medicines, herbs, non-prescription drugs, or dietary supplements you use. Also tell them if you smoke, drink alcohol, or use illegal drugs. Some items may interact with your medicine. What should I watch for while using this medication? Visit your care team for regular checks on your progress. Tell your care team if your symptoms do not start to get better or if they get worse. Your condition will be monitored carefully while you are receiving this medication. Wear a medical ID bracelet or chain. Carry a card that describes your condition. List the medications and doses you take on the card. This medication may increase your risk of getting an infection. Call your care team for advice if you get a fever, chills, sore throat, or other symptoms of a cold or flu. Do  not treat yourself. Try to avoid being around people who are sick. If you have not had the measles or chickenpox vaccines, tell your care team right away if you are around someone with these viruses. If you are going to need surgery or other procedure, tell your care team that you are using this medication. You may need to be on a special diet while you are taking this medication. Ask your care team. Also, find out how many glasses of fluids you need to drink each day. This medication may increase blood sugar. The risk may be higher in patients who already have diabetes. Ask your care team what you can do to lower your risk of diabetes while taking this medication. What side effects may I notice from receiving this medication? Side effects that you should report to your care team as soon as possible: Allergic reactions--skin rash, itching, hives, swelling of the face, lips, tongue, or throat Cushing syndrome--increased fat around the midsection, upper back, neck, or face, pink or purple stretch marks on the skin, thinning, fragile skin that easily bruises, unexpected hair growth High blood sugar (hyperglycemia)--increased thirst or amount of urine, unusual weakness or fatigue, blurry vision Increase in blood pressure Infection--fever, chills, cough, sore throat, wounds that don't heal, pain or trouble when passing urine, general feeling of discomfort or being unwell Low adrenal gland function--nausea, vomiting, loss of appetite, unusual weakness or fatigue, dizziness Mood  and behavior changes--anxiety, nervousness, confusion, hallucinations, irritability, hostility, thoughts of suicide or self-harm, worsening mood, feelings of depression Stomach bleeding--bloody or black, tar-like stools, vomiting blood or brown material that looks like coffee grounds Swelling of the ankles, hands, or feet Side effects that usually do not require medical attention (report to your care team if they continue or are  bothersome): Acne General discomfort and fatigue Headache Increase in appetite Nausea Trouble sleeping Weight gain This list may not describe all possible side effects. Call your doctor for medical advice about side effects. You may report side effects to FDA at 1-800-FDA-1088. Where should I keep my medication? This medication is given in a hospital or clinic. It will not be stored at home. NOTE: This sheet is a summary. It may not cover all possible information. If you have questions about this medicine, talk to your doctor, pharmacist, or health care provider.  2024 Elsevier/Gold Standard (2021-07-05 00:00:00)  Potassium Chloride Injection What is this medication? POTASSIUM CHLORIDE (poe TASS i um KLOOR ide) prevents and treats low levels of potassium in your body. Potassium plays an important role in maintaining the health of your kidneys, heart, muscles, and nervous system. This medicine may be used for other purposes; ask your health care provider or pharmacist if you have questions. COMMON BRAND NAME(S): PROAMP What should I tell my care team before I take this medication? They need to know if you have any of these conditions: Addison disease Dehydration Diabetes (high blood sugar) Heart disease High levels of potassium in the blood Irregular heartbeat or rhythm Kidney disease Large areas of burned skin An unusual or allergic reaction to potassium, other medications, foods, dyes, or preservatives Pregnant or trying to get pregnant Breast-feeding How should I use this medication? This medication is injected into a vein. It is given in a hospital or clinic setting. Talk to your care team about the use of this medication in children. Special care may be needed. Overdosage: If you think you have taken too much of this medicine contact a poison control center or emergency room at once. NOTE: This medicine is only for you. Do not share this medicine with others. What if I miss  a dose? This does not apply. This medication is not for regular use. What may interact with this medication? Do not take this medication with any of the following: Certain diuretics, such as spironolactone, triamterene Eplerenone Sodium polystyrene sulfonate This medication may also interact with the following: Certain medications for blood pressure or heart disease, such as lisinopril, losartan, quinapril, valsartan Medications that lower your chance of fighting infection, such as cyclosporine, tacrolimus NSAIDs, medications for pain and inflammation, such as ibuprofen or naproxen Other potassium supplements Salt substitutes This list may not describe all possible interactions. Give your health care provider a list of all the medicines, herbs, non-prescription drugs, or dietary supplements you use. Also tell them if you smoke, drink alcohol, or use illegal drugs. Some items may interact with your medicine. What should I watch for while using this medication? Visit your care team for regular checks on your progress. Tell your care team if your symptoms do not start to get better or if they get worse. You may need blood work while you are taking this medication. Avoid salt substitutes unless you are told otherwise by your care team. What side effects may I notice from receiving this medication? Side effects that you should report to your care team as soon as possible: Allergic reactions--skin rash, itching,  hives, swelling of the face, lips, tongue, or throat High potassium level--muscle weakness, fast or irregular heartbeat Side effects that usually do not require medical attention (report to your care team if they continue or are bothersome): Diarrhea Nausea Stomach pain Vomiting This list may not describe all possible side effects. Call your doctor for medical advice about side effects. You may report side effects to FDA at 1-800-FDA-1088. Where should I keep my medication? This  medication is given in a hospital or clinic. It will not be stored at home. NOTE: This sheet is a summary. It may not cover all possible information. If you have questions about this medicine, talk to your doctor, pharmacist, or health care provider.  2024 Elsevier/Gold Standard (2021-08-10 00:00:00)  Ondansetron Injection What is this medication? ONDANSETRON (on DAN se tron) prevents nausea and vomiting from chemotherapy, radiation, or surgery. It works by blocking substances in the body that may cause nausea or vomiting. It belongs to a class of medications called antiemetics. This medicine may be used for other purposes; ask your health care provider or pharmacist if you have questions. COMMON BRAND NAME(S): Zofran, Zofran in Dextrose, Zofran Solution What should I tell my care team before I take this medication? They need to know if you have any of these conditions: Heart disease History of irregular heartbeat Liver disease Low levels of magnesium or potassium in the blood An unusual or allergic reaction to ondansetron, granisetron, other medications, foods, dyes, or preservatives Pregnant or trying to get pregnant Breast-feeding How should I use this medication? This medication is injected into a vein. It is given by your care team in a hospital or clinic setting. Talk to your care team about the use of this medication in children. Special care may be needed. Overdosage: If you think you have taken too much of this medicine contact a poison control center or emergency room at once. NOTE: This medicine is only for you. Do not share this medicine with others. What if I miss a dose? This does not apply. What may interact with this medication? Do not take this medication with any of the following: Apomorphine Certain medications for fungal infections, such as fluconazole, itraconazole, ketoconazole, posaconazole, voriconazole Cisapride Dronedarone Pimozide Thioridazine This  medication may also interact with the following: Carbamazepine Certain medications for depression, anxiety, or mental health conditions Fentanyl Linezolid MAOIs, such as Carbex, Eldepryl, Marplan, Nardil, and Parnate Methylene blue (injected into a vein) Other medications that cause heart rhythm changes, such as dofetilide, ziprasidone Phenytoin Rifampicin Tramadol This list may not describe all possible interactions. Give your health care provider a list of all the medicines, herbs, non-prescription drugs, or dietary supplements you use. Also tell them if you smoke, drink alcohol, or use illegal drugs. Some items may interact with your medicine. What should I watch for while using this medication? Your condition will be monitored carefully while you are receiving this medication. What side effects may I notice from receiving this medication? Side effects that you should report to your care team as soon as possible: Allergic reactions--skin rash, itching, hives, swelling of the face, lips, tongue, or throat Bowel blockage--stomach cramping, unable to have a bowel movement or pass gas, loss of appetite, vomiting Chest pain (angina)--pain, pressure, or tightness in the chest, neck, back, or arms Heart rhythm changes--fast or irregular heartbeat, dizziness, feeling faint or lightheaded, chest pain, trouble breathing Irritability, confusion, fast or irregular heartbeat, muscle stiffness, twitching muscles, sweating, high fever, seizure, chills, vomiting, diarrhea, which may  be signs of serotonin syndrome Side effects that usually do not require medical attention (report to your care team if they continue or are bothersome): Constipation Diarrhea General discomfort and fatigue Headache This list may not describe all possible side effects. Call your doctor for medical advice about side effects. You may report side effects to FDA at 1-800-FDA-1088. Where should I keep my medication? This  medication is given in a hospital or clinic and will not be stored at home. NOTE: This sheet is a summary. It may not cover all possible information. If you have questions about this medicine, talk to your doctor, pharmacist, or health care provider.  2024 Elsevier/Gold Standard (2021-06-28 00:00:00)

## 2022-08-23 NOTE — Assessment & Plan Note (Addendum)
Biopsy and PET scan results were reviewed and discussed with patient. 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  Pending ENT evaluation. P16 positive. Recommend concurrent chemotherapy cisplatin and radiation Labs are reviewed and discussed with patient. Hold cisplatin 40mg /m2 - he is finishing RT today IV fluid hydration session PRN Follow-up with radiation oncology for radiation. Follow up with ENT 8 weeks after radiation.  Repeat PET scan in 10-12 weeks for evaluation treatment response.

## 2022-08-23 NOTE — Progress Notes (Signed)
Nutrition Follow-up:  Patient with stage IV right tonsil cancer, p 16+.  Patient completed last radiation treatment today, chemo held for today.   Met with patient and niece during infusion.  Patient unable to keep any solid food down for the past week due to nausea and vomiting.  Has been receiving supportive care (fluids, electrolytes) this week.  Has been taking zofran ODT. Pharmacy was out of zyprexa and has not been taking.  Phenergan suppository added today per MD.      Medications: phenergan suppository added, nystatin added, zofran ODT, zyprexa ODT  Labs: K 3.1, glucose 105  Anthropometrics:   Weight 140 lb today  152 lb 4.8 oz on 6/27 154 lb 4.8 oz on 6/20 160 lb 1.6 oz on 6/7 159 lb 6.4 oz on 5/31 169 lb 9.6 oz on 5/23 170 lb on 5/15   NUTRITION DIAGNOSIS: Predicted sub optimal energy intake continues   INTERVENTION:  Spoke with MD regarding scheduling patient for [facility] visit on 7/15 for support. Patient agreeable to coming.  Encouraged antiemetic medication scheduled Discussed small sips of liquids/bland foods q 30 minutes/1 hour (brothy soups, popsicle, sherbet, etc)    MONITORING, EVALUATION, GOAL: weight trends, intake   NEXT VISIT: Wednesday, July 17 while in clinic  Cynthie Garmon B. Freida Busman, RD, LDN Registered Dietitian [phone]

## 2022-08-23 NOTE — Assessment & Plan Note (Signed)
Recommend patient to utilize his antiemetics.  Today he will get IV Zofran 8mg  x 1, IV pepcid 20mg  x 1, IV Dex 10mg  x 1 Recommend add Phenergan 12.5mg  Q6h suppository PRN, continue Zofran ODT PRN Ativan 0.5 mg every 12 hours as needed for anxiety/sleep/nausea vomiting.   Acid reflux, continue omeprazole 20mg  daily. He uses otc supplies

## 2022-08-24 ENCOUNTER — Other Ambulatory Visit: Payer: Self-pay

## 2022-08-26 ENCOUNTER — Inpatient Hospital Stay: Payer: Medicare Other | Admitting: Hospice and Palliative Medicine

## 2022-08-26 ENCOUNTER — Inpatient Hospital Stay: Payer: Medicare Other

## 2022-08-26 ENCOUNTER — Encounter: Payer: Self-pay | Admitting: Hospice and Palliative Medicine

## 2022-08-26 ENCOUNTER — Other Ambulatory Visit: Payer: Self-pay

## 2022-08-26 VITALS — BP 127/83 | HR 74 | Temp 97.0°F | Resp 20

## 2022-08-26 DIAGNOSIS — E860 Dehydration: Secondary | ICD-10-CM | POA: Diagnosis not present

## 2022-08-26 DIAGNOSIS — E876 Hypokalemia: Secondary | ICD-10-CM

## 2022-08-26 DIAGNOSIS — C099 Malignant neoplasm of tonsil, unspecified: Secondary | ICD-10-CM

## 2022-08-26 DIAGNOSIS — Z510 Encounter for antineoplastic radiation therapy: Secondary | ICD-10-CM | POA: Diagnosis not present

## 2022-08-26 LAB — CBC WITH DIFFERENTIAL (CANCER CENTER ONLY)
Abs Immature Granulocytes: 0.02 10*3/uL (ref 0.00–0.07)
Basophils Absolute: 0 10*3/uL (ref 0.0–0.1)
Basophils Relative: 0 %
Eosinophils Absolute: 0 10*3/uL (ref 0.0–0.5)
Eosinophils Relative: 0 %
HCT: 33.8 % — ABNORMAL LOW (ref 39.0–52.0)
Hemoglobin: 11.4 g/dL — ABNORMAL LOW (ref 13.0–17.0)
Immature Granulocytes: 1 %
Lymphocytes Relative: 19 %
Lymphs Abs: 0.5 10*3/uL — ABNORMAL LOW (ref 0.7–4.0)
MCH: 26.2 pg (ref 26.0–34.0)
MCHC: 33.7 g/dL (ref 30.0–36.0)
MCV: 77.7 fL — ABNORMAL LOW (ref 80.0–100.0)
Monocytes Absolute: 0.3 10*3/uL (ref 0.1–1.0)
Monocytes Relative: 12 %
Neutro Abs: 1.9 10*3/uL (ref 1.7–7.7)
Neutrophils Relative %: 68 %
Platelet Count: 166 10*3/uL (ref 150–400)
RBC: 4.35 MIL/uL (ref 4.22–5.81)
RDW: 17 % — ABNORMAL HIGH (ref 11.5–15.5)
WBC Count: 2.8 10*3/uL — ABNORMAL LOW (ref 4.0–10.5)
nRBC: 0 % (ref 0.0–0.2)

## 2022-08-26 LAB — MAGNESIUM: Magnesium: 1.5 mg/dL — ABNORMAL LOW (ref 1.7–2.4)

## 2022-08-26 LAB — CMP (CANCER CENTER ONLY)
ALT: 31 U/L (ref 0–44)
AST: 32 U/L (ref 15–41)
Albumin: 3.9 g/dL (ref 3.5–5.0)
Alkaline Phosphatase: 64 U/L (ref 38–126)
Anion gap: 16 — ABNORMAL HIGH (ref 5–15)
BUN: 27 mg/dL — ABNORMAL HIGH (ref 8–23)
CO2: 23 mmol/L (ref 22–32)
Calcium: 9.2 mg/dL (ref 8.9–10.3)
Chloride: 101 mmol/L (ref 98–111)
Creatinine: 1.25 mg/dL — ABNORMAL HIGH (ref 0.61–1.24)
GFR, Estimated: >60 mL/min (ref >60)
Glucose, Bld: 175 mg/dL — ABNORMAL HIGH (ref 70–99)
Potassium: 3 mmol/L — ABNORMAL LOW (ref 3.5–5.1)
Sodium: 140 mmol/L (ref 135–145)
Total Bilirubin: 0.9 mg/dL (ref 0.3–1.2)
Total Protein: 6.9 g/dL (ref 6.5–8.1)

## 2022-08-26 MED ORDER — POTASSIUM CHLORIDE CRYS ER 20 MEQ PO TBCR: 20.0000 meq | EXTENDED_RELEASE_TABLET | Freq: Two times a day (BID) | ORAL | 0 refills | Status: DC

## 2022-08-26 MED ORDER — SODIUM CHLORIDE 0.9% FLUSH
10.0000 mL | Freq: Once | INTRAVENOUS | Status: AC
  Administered 2022-08-26: 10 mL via INTRAVENOUS
  Filled 2022-08-26: qty 10

## 2022-08-26 MED ORDER — HEPARIN SOD (PORK) LOCK FLUSH 100 UNIT/ML IV SOLN
500.0000 [IU] | Freq: Once | INTRAVENOUS | Status: AC
  Administered 2022-08-26: 500 [IU] via INTRAVENOUS
  Filled 2022-08-26: qty 5

## 2022-08-26 MED ORDER — POTASSIUM CHLORIDE 20 MEQ/100ML IV SOLN
20.0000 meq | Freq: Once | INTRAVENOUS | Status: AC
  Administered 2022-08-26: 20 meq via INTRAVENOUS

## 2022-08-26 MED ORDER — MAGNESIUM SULFATE 2 GM/50ML IV SOLN
2.0000 g | Freq: Once | INTRAVENOUS | Status: AC
  Administered 2022-08-26: 2 g via INTRAVENOUS

## 2022-08-26 MED ORDER — SODIUM CHLORIDE 0.9 % IV SOLN
INTRAVENOUS | Status: DC
  Filled 2022-08-26 (×2): qty 250
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
## 2022-08-27 ENCOUNTER — Other Ambulatory Visit: Payer: Self-pay

## 2022-08-27 DIAGNOSIS — E860 Dehydration: Secondary | ICD-10-CM

## 2022-08-28 ENCOUNTER — Other Ambulatory Visit: Payer: Self-pay

## 2022-08-28 ENCOUNTER — Other Ambulatory Visit: Payer: Self-pay | Admitting: *Deleted

## 2022-08-28 ENCOUNTER — Inpatient Hospital Stay: Payer: Medicare Other

## 2022-08-28 ENCOUNTER — Inpatient Hospital Stay: Payer: Medicare Other | Admitting: Medical Oncology

## 2022-08-28 VITALS — Temp 97.6°F | Resp 20 | Ht 70.0 in | Wt 138.0 lb

## 2022-08-28 VITALS — BP 161/85 | HR 72

## 2022-08-28 DIAGNOSIS — R110 Nausea: Secondary | ICD-10-CM

## 2022-08-28 DIAGNOSIS — E876 Hypokalemia: Secondary | ICD-10-CM | POA: Diagnosis not present

## 2022-08-28 DIAGNOSIS — C099 Malignant neoplasm of tonsil, unspecified: Secondary | ICD-10-CM

## 2022-08-28 DIAGNOSIS — E860 Dehydration: Secondary | ICD-10-CM

## 2022-08-28 DIAGNOSIS — D701 Agranulocytosis secondary to cancer chemotherapy: Secondary | ICD-10-CM

## 2022-08-28 DIAGNOSIS — T451X5A Adverse effect of antineoplastic and immunosuppressive drugs, initial encounter: Secondary | ICD-10-CM

## 2022-08-28 DIAGNOSIS — Z510 Encounter for antineoplastic radiation therapy: Secondary | ICD-10-CM | POA: Diagnosis not present

## 2022-08-28 LAB — COMPREHENSIVE METABOLIC PANEL
ALT: 30 U/L (ref 0–44)
AST: 29 U/L (ref 15–41)
Albumin: 3.7 g/dL (ref 3.5–5.0)
Alkaline Phosphatase: 63 U/L (ref 38–126)
Anion gap: 16 — ABNORMAL HIGH (ref 5–15)
BUN: 25 mg/dL — ABNORMAL HIGH (ref 8–23)
CO2: 23 mmol/L (ref 22–32)
Calcium: 9.2 mg/dL (ref 8.9–10.3)
Chloride: 101 mmol/L (ref 98–111)
Creatinine, Ser: 1.15 mg/dL (ref 0.61–1.24)
GFR, Estimated: >60 mL/min (ref >60)
Glucose, Bld: 136 mg/dL — ABNORMAL HIGH (ref 70–99)
Potassium: 3.2 mmol/L — ABNORMAL LOW (ref 3.5–5.1)
Sodium: 140 mmol/L (ref 135–145)
Total Bilirubin: 1 mg/dL (ref 0.3–1.2)
Total Protein: 6.7 g/dL (ref 6.5–8.1)

## 2022-08-28 LAB — CBC WITH DIFFERENTIAL/PLATELET
Abs Immature Granulocytes: 0.01 10*3/uL (ref 0.00–0.07)
Basophils Absolute: 0 10*3/uL (ref 0.0–0.1)
Basophils Relative: 0 %
Eosinophils Absolute: 0 10*3/uL (ref 0.0–0.5)
Eosinophils Relative: 0 %
HCT: 33.6 % — ABNORMAL LOW (ref 39.0–52.0)
Hemoglobin: 11.4 g/dL — ABNORMAL LOW (ref 13.0–17.0)
Immature Granulocytes: 0 %
Lymphocytes Relative: 23 %
Lymphs Abs: 0.5 10*3/uL — ABNORMAL LOW (ref 0.7–4.0)
MCH: 26.5 pg (ref 26.0–34.0)
MCHC: 33.9 g/dL (ref 30.0–36.0)
MCV: 78.1 fL — ABNORMAL LOW (ref 80.0–100.0)
Monocytes Absolute: 0.3 10*3/uL (ref 0.1–1.0)
Monocytes Relative: 13 %
Neutro Abs: 1.5 10*3/uL — ABNORMAL LOW (ref 1.7–7.7)
Neutrophils Relative %: 64 %
Platelets: 152 10*3/uL (ref 150–400)
RBC: 4.3 MIL/uL (ref 4.22–5.81)
RDW: 17.6 % — ABNORMAL HIGH (ref 11.5–15.5)
WBC: 2.4 10*3/uL — ABNORMAL LOW (ref 4.0–10.5)
nRBC: 0 % (ref 0.0–0.2)

## 2022-08-28 LAB — MAGNESIUM: Magnesium: 1.6 mg/dL — ABNORMAL LOW (ref 1.7–2.4)

## 2022-08-28 MED ORDER — HEPARIN SOD (PORK) LOCK FLUSH 100 UNIT/ML IV SOLN
500.0000 [IU] | Freq: Once | INTRAVENOUS | Status: AC
  Administered 2022-08-28: 500 [IU] via INTRAVENOUS
  Filled 2022-08-28: qty 5

## 2022-08-28 MED ORDER — POTASSIUM CHLORIDE 20 MEQ/100ML IV SOLN
20.0000 meq | Freq: Once | INTRAVENOUS | Status: AC
  Administered 2022-08-28: 20 meq via INTRAVENOUS

## 2022-08-28 MED ORDER — SODIUM CHLORIDE 0.9% FLUSH
10.0000 mL | Freq: Once | INTRAVENOUS | Status: DC
  Filled 2022-08-28: qty 10

## 2022-08-28 MED ORDER — FAMOTIDINE IN NACL 20-0.9 MG/50ML-% IV SOLN
20.0000 mg | Freq: Once | INTRAVENOUS | Status: AC
  Administered 2022-08-28: 20 mg via INTRAVENOUS
  Filled 2022-08-28: qty 50

## 2022-08-28 MED ORDER — DEXAMETHASONE SODIUM PHOSPHATE 10 MG/ML IJ SOLN
4.0000 mg | Freq: Once | INTRAMUSCULAR | Status: AC
  Administered 2022-08-28: 4 mg via INTRAVENOUS
  Filled 2022-08-28: qty 1

## 2022-08-28 MED ORDER — MAGNESIUM SULFATE 2 GM/50ML IV SOLN
2.0000 g | Freq: Once | INTRAVENOUS | Status: AC
  Administered 2022-08-28: 2 g via INTRAVENOUS
  Filled 2022-08-28: qty 50

## 2022-08-28 MED ORDER — SODIUM CHLORIDE 0.9 % IV SOLN
INTRAVENOUS | Status: DC
  Filled 2022-08-28 (×2): qty 250
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
## 2022-08-28 NOTE — Progress Notes (Signed)
Nutrition Follow-up:  Patient with stage IV right tonsil cancer, p 16+.  Patient completed last radiation on 7/12, last chemo held.    Met with patient during fluids.  Appetite continues to be poor due to foods/beverages having "off" taste and/or nausea and vomiting.  Also has thrush.     Medications: phenergan suppository, zyprexa, zofran, nystatin  Labs: K 3.2, glucose 136, BUN 25, creatinine 1.15, Mag 1.6  Anthropometrics:   Weight 138 lb  140 lb on 7/12 152 lb 4.8 oz on 6/27 154 lb 4.8 oz on 6/20 160 lb 1.6 oz on 6/7 159 lb 6.4 oz on 5/31 169 lb 9.6 oz on 5/23 170 lb on 5/15  19% weight loss in the last 2 months, significant  NUTRITION DIAGNOSIS: Predicted sub optimal energy intake continues   INTERVENTION:  Encouraged taking antiemetic medication to help control symptoms Continue small sips/bites of liquids/bland foods     MONITORING, EVALUATION, GOAL: weight trends, intake   NEXT VISIT: next week  Rakim Moone B. Freida Busman, RD, LDN Registered Dietitian [phone]

## 2022-08-30 ENCOUNTER — Inpatient Hospital Stay: Payer: Medicare Other

## 2022-08-30 ENCOUNTER — Encounter: Payer: Self-pay | Admitting: Medical Oncology

## 2022-08-30 ENCOUNTER — Inpatient Hospital Stay (HOSPITAL_BASED_OUTPATIENT_CLINIC_OR_DEPARTMENT_OTHER): Payer: Medicare Other | Admitting: Medical Oncology

## 2022-08-30 VITALS — BP 130/87 | HR 72 | Temp 97.4°F | Wt 134.4 lb

## 2022-08-30 DIAGNOSIS — T451X5A Adverse effect of antineoplastic and immunosuppressive drugs, initial encounter: Secondary | ICD-10-CM

## 2022-08-30 DIAGNOSIS — E876 Hypokalemia: Secondary | ICD-10-CM | POA: Diagnosis not present

## 2022-08-30 DIAGNOSIS — C099 Malignant neoplasm of tonsil, unspecified: Secondary | ICD-10-CM

## 2022-08-30 DIAGNOSIS — E860 Dehydration: Secondary | ICD-10-CM

## 2022-08-30 DIAGNOSIS — D701 Agranulocytosis secondary to cancer chemotherapy: Secondary | ICD-10-CM

## 2022-08-30 DIAGNOSIS — Z510 Encounter for antineoplastic radiation therapy: Secondary | ICD-10-CM | POA: Diagnosis not present

## 2022-08-30 DIAGNOSIS — R110 Nausea: Secondary | ICD-10-CM

## 2022-08-30 LAB — CBC WITH DIFFERENTIAL (CANCER CENTER ONLY)
Abs Immature Granulocytes: 0.02 10*3/uL (ref 0.00–0.07)
Basophils Absolute: 0 10*3/uL (ref 0.0–0.1)
Basophils Relative: 0 %
Eosinophils Absolute: 0 10*3/uL (ref 0.0–0.5)
Eosinophils Relative: 0 %
HCT: 32.7 % — ABNORMAL LOW (ref 39.0–52.0)
Hemoglobin: 10.9 g/dL — ABNORMAL LOW (ref 13.0–17.0)
Immature Granulocytes: 1 %
Lymphocytes Relative: 27 %
Lymphs Abs: 0.6 10*3/uL — ABNORMAL LOW (ref 0.7–4.0)
MCH: 26.1 pg (ref 26.0–34.0)
MCHC: 33.3 g/dL (ref 30.0–36.0)
MCV: 78.2 fL — ABNORMAL LOW (ref 80.0–100.0)
Monocytes Absolute: 0.4 10*3/uL (ref 0.1–1.0)
Monocytes Relative: 15 %
Neutro Abs: 1.3 10*3/uL — ABNORMAL LOW (ref 1.7–7.7)
Neutrophils Relative %: 57 %
Platelet Count: 144 10*3/uL — ABNORMAL LOW (ref 150–400)
RBC: 4.18 MIL/uL — ABNORMAL LOW (ref 4.22–5.81)
RDW: 18 % — ABNORMAL HIGH (ref 11.5–15.5)
WBC Count: 2.4 10*3/uL — ABNORMAL LOW (ref 4.0–10.5)
nRBC: 0 % (ref 0.0–0.2)

## 2022-08-30 LAB — CMP (CANCER CENTER ONLY)
ALT: 35 U/L (ref 0–44)
AST: 34 U/L (ref 15–41)
Albumin: 3.6 g/dL (ref 3.5–5.0)
Alkaline Phosphatase: 58 U/L (ref 38–126)
Anion gap: 15 (ref 5–15)
BUN: 23 mg/dL (ref 8–23)
CO2: 24 mmol/L (ref 22–32)
Calcium: 9.1 mg/dL (ref 8.9–10.3)
Chloride: 100 mmol/L (ref 98–111)
Creatinine: 0.91 mg/dL (ref 0.61–1.24)
GFR, Estimated: >60 mL/min (ref >60)
Glucose, Bld: 92 mg/dL (ref 70–99)
Potassium: 3.1 mmol/L — ABNORMAL LOW (ref 3.5–5.1)
Sodium: 139 mmol/L (ref 135–145)
Total Bilirubin: 0.8 mg/dL (ref 0.3–1.2)
Total Protein: 6.4 g/dL — ABNORMAL LOW (ref 6.5–8.1)

## 2022-08-30 LAB — MAGNESIUM: Magnesium: 1.5 mg/dL — ABNORMAL LOW (ref 1.7–2.4)

## 2022-08-30 MED ORDER — SODIUM CHLORIDE 0.9% FLUSH
3.0000 mL | Freq: Once | INTRAVENOUS | Status: DC | PRN
  Filled 2022-08-30: qty 3

## 2022-08-30 MED ORDER — SODIUM CHLORIDE 0.9 % IV SOLN
INTRAVENOUS | Status: DC
  Filled 2022-08-30 (×2): qty 250

## 2022-08-30 MED ORDER — HEPARIN SOD (PORK) LOCK FLUSH 100 UNIT/ML IV SOLN
500.0000 [IU] | Freq: Once | INTRAVENOUS | Status: AC | PRN
  Administered 2022-08-30: 500 [IU]
  Filled 2022-08-30: qty 5

## 2022-08-30 MED ORDER — SODIUM CHLORIDE 0.9% FLUSH
10.0000 mL | Freq: Once | INTRAVENOUS | Status: AC | PRN
  Administered 2022-08-30: 10 mL
  Filled 2022-08-30: qty 10

## 2022-08-30 MED ORDER — DEXAMETHASONE SODIUM PHOSPHATE 10 MG/ML IJ SOLN
4.0000 mg | Freq: Once | INTRAMUSCULAR | Status: AC
  Administered 2022-08-30: 4 mg via INTRAVENOUS

## 2022-08-30 MED ORDER — SODIUM CHLORIDE 0.9 % IV SOLN: 4.0000 mg | Freq: Once | INTRAVENOUS | Status: DC

## 2022-08-30 MED ORDER — HEPARIN SOD (PORK) LOCK FLUSH 100 UNIT/ML IV SOLN
250.0000 [IU] | Freq: Once | INTRAVENOUS | Status: DC | PRN
  Filled 2022-08-30: qty 5

## 2022-08-30 MED ORDER — POTASSIUM CHLORIDE 20 MEQ/100ML IV SOLN
20.0000 meq | Freq: Once | INTRAVENOUS | Status: AC
  Administered 2022-08-30: 20 meq via INTRAVENOUS

## 2022-08-30 MED ORDER — FAMOTIDINE IN NACL 20-0.9 MG/50ML-% IV SOLN
20.0000 mg | Freq: Once | INTRAVENOUS | Status: AC
  Administered 2022-08-30: 20 mg via INTRAVENOUS

## 2022-08-30 MED ORDER — ALTEPLASE 2 MG IJ SOLR
2.0000 mg | Freq: Once | INTRAMUSCULAR | Status: DC | PRN
  Filled 2022-08-30: qty 2

## 2022-08-30 MED ORDER — MAGNESIUM SULFATE 2 GM/50ML IV SOLN
2.0000 g | Freq: Once | INTRAVENOUS | Status: AC
  Administered 2022-08-30: 2 g via INTRAVENOUS
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
## 2022-09-02 ENCOUNTER — Encounter: Payer: Self-pay | Admitting: Nurse Practitioner

## 2022-09-02 ENCOUNTER — Inpatient Hospital Stay: Payer: Medicare Other

## 2022-09-02 ENCOUNTER — Inpatient Hospital Stay (HOSPITAL_BASED_OUTPATIENT_CLINIC_OR_DEPARTMENT_OTHER): Payer: Medicare Other | Admitting: Nurse Practitioner

## 2022-09-02 VITALS — BP 124/87 | HR 87 | Temp 96.0°F | Wt 134.0 lb

## 2022-09-02 DIAGNOSIS — K208 Other esophagitis without bleeding: Secondary | ICD-10-CM | POA: Diagnosis not present

## 2022-09-02 DIAGNOSIS — T451X5A Adverse effect of antineoplastic and immunosuppressive drugs, initial encounter: Secondary | ICD-10-CM

## 2022-09-02 DIAGNOSIS — C099 Malignant neoplasm of tonsil, unspecified: Secondary | ICD-10-CM

## 2022-09-02 DIAGNOSIS — T66XXXA Radiation sickness, unspecified, initial encounter: Secondary | ICD-10-CM

## 2022-09-02 DIAGNOSIS — E860 Dehydration: Secondary | ICD-10-CM

## 2022-09-02 DIAGNOSIS — Z510 Encounter for antineoplastic radiation therapy: Secondary | ICD-10-CM | POA: Diagnosis not present

## 2022-09-02 DIAGNOSIS — B370 Candidal stomatitis: Secondary | ICD-10-CM | POA: Diagnosis not present

## 2022-09-02 DIAGNOSIS — E876 Hypokalemia: Secondary | ICD-10-CM | POA: Diagnosis not present

## 2022-09-02 LAB — CBC WITH DIFFERENTIAL/PLATELET
Abs Immature Granulocytes: 0.02 10*3/uL (ref 0.00–0.07)
Basophils Absolute: 0 10*3/uL (ref 0.0–0.1)
Basophils Relative: 0 %
Eosinophils Absolute: 0 10*3/uL (ref 0.0–0.5)
Eosinophils Relative: 0 %
HCT: 35 % — ABNORMAL LOW (ref 39.0–52.0)
Hemoglobin: 11.6 g/dL — ABNORMAL LOW (ref 13.0–17.0)
Immature Granulocytes: 1 %
Lymphocytes Relative: 28 %
Lymphs Abs: 0.7 10*3/uL (ref 0.7–4.0)
MCH: 26.4 pg (ref 26.0–34.0)
MCHC: 33.1 g/dL (ref 30.0–36.0)
MCV: 79.5 fL — ABNORMAL LOW (ref 80.0–100.0)
Monocytes Absolute: 0.4 10*3/uL (ref 0.1–1.0)
Monocytes Relative: 14 %
Neutro Abs: 1.4 10*3/uL — ABNORMAL LOW (ref 1.7–7.7)
Neutrophils Relative %: 57 %
Platelets: 178 10*3/uL (ref 150–400)
RBC: 4.4 MIL/uL (ref 4.22–5.81)
RDW: 18.4 % — ABNORMAL HIGH (ref 11.5–15.5)
WBC: 2.5 10*3/uL — ABNORMAL LOW (ref 4.0–10.5)
nRBC: 0 % (ref 0.0–0.2)

## 2022-09-02 LAB — CMP (CANCER CENTER ONLY)
ALT: 34 U/L (ref 0–44)
AST: 30 U/L (ref 15–41)
Albumin: 3.6 g/dL (ref 3.5–5.0)
Alkaline Phosphatase: 61 U/L (ref 38–126)
Anion gap: 14 (ref 5–15)
BUN: 22 mg/dL (ref 8–23)
CO2: 26 mmol/L (ref 22–32)
Calcium: 9.3 mg/dL (ref 8.9–10.3)
Chloride: 97 mmol/L — ABNORMAL LOW (ref 98–111)
Creatinine: 1.08 mg/dL (ref 0.61–1.24)
GFR, Estimated: >60 mL/min (ref >60)
Glucose, Bld: 115 mg/dL — ABNORMAL HIGH (ref 70–99)
Potassium: 3.2 mmol/L — ABNORMAL LOW (ref 3.5–5.1)
Sodium: 137 mmol/L (ref 135–145)
Total Bilirubin: 0.9 mg/dL (ref 0.3–1.2)
Total Protein: 6.5 g/dL (ref 6.5–8.1)

## 2022-09-02 LAB — MAGNESIUM: Magnesium: 1.6 mg/dL — ABNORMAL LOW (ref 1.7–2.4)

## 2022-09-02 MED ORDER — ONDANSETRON HCL 40 MG/20ML IJ SOLN
Freq: Once | INTRAMUSCULAR | Status: DC
Start: 2022-09-02 — End: 2022-09-02

## 2022-09-02 MED ORDER — MAGNESIUM SULFATE 2 GM/50ML IV SOLN
2.0000 g | Freq: Once | INTRAVENOUS | Status: AC
  Administered 2022-09-02: 2 g via INTRAVENOUS
  Filled 2022-09-02: qty 50

## 2022-09-02 MED ORDER — HEPARIN SOD (PORK) LOCK FLUSH 100 UNIT/ML IV SOLN
500.0000 [IU] | Freq: Once | INTRAVENOUS | Status: AC | PRN
  Administered 2022-09-02: 500 [IU]
  Filled 2022-09-02: qty 5

## 2022-09-02 MED ORDER — SODIUM CHLORIDE 0.9% FLUSH
10.0000 mL | Freq: Once | INTRAVENOUS | Status: AC | PRN
  Administered 2022-09-02: 10 mL
  Filled 2022-09-02: qty 10

## 2022-09-02 MED ORDER — SODIUM CHLORIDE 0.9 % IV SOLN
INTRAVENOUS | Status: DC
  Filled 2022-09-02 (×2): qty 250

## 2022-09-02 MED ORDER — POTASSIUM CHLORIDE 20 MEQ/100ML IV SOLN
20.0000 meq | Freq: Once | INTRAVENOUS | Status: AC
  Administered 2022-09-02: 20 meq via INTRAVENOUS

## 2022-09-02 MED ORDER — ONDANSETRON HCL 4 MG/2ML IJ SOLN
8.0000 mg | Freq: Once | INTRAMUSCULAR | Status: AC
  Administered 2022-09-02: 8 mg via INTRAVENOUS
  Filled 2022-09-02: qty 4
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
## 2022-09-03 ENCOUNTER — Encounter: Payer: Self-pay | Admitting: Oncology

## 2022-09-04 ENCOUNTER — Inpatient Hospital Stay: Payer: Medicare Other

## 2022-09-04 VITALS — BP 122/77 | HR 75 | Temp 96.2°F | Resp 20

## 2022-09-04 DIAGNOSIS — T451X5A Adverse effect of antineoplastic and immunosuppressive drugs, initial encounter: Secondary | ICD-10-CM

## 2022-09-04 DIAGNOSIS — E860 Dehydration: Secondary | ICD-10-CM

## 2022-09-04 DIAGNOSIS — Z510 Encounter for antineoplastic radiation therapy: Secondary | ICD-10-CM | POA: Diagnosis not present

## 2022-09-04 DIAGNOSIS — C099 Malignant neoplasm of tonsil, unspecified: Secondary | ICD-10-CM

## 2022-09-04 LAB — BASIC METABOLIC PANEL - CANCER CENTER ONLY
Anion gap: 12 (ref 5–15)
BUN: 17 mg/dL (ref 8–23)
CO2: 25 mmol/L (ref 22–32)
Calcium: 9.2 mg/dL (ref 8.9–10.3)
Chloride: 98 mmol/L (ref 98–111)
Creatinine: 0.92 mg/dL (ref 0.61–1.24)
GFR, Estimated: >60 mL/min (ref >60)
Glucose, Bld: 97 mg/dL (ref 70–99)
Potassium: 3.4 mmol/L — ABNORMAL LOW (ref 3.5–5.1)
Sodium: 135 mmol/L (ref 135–145)

## 2022-09-04 LAB — MAGNESIUM: Magnesium: 1.7 mg/dL (ref 1.7–2.4)

## 2022-09-04 MED ORDER — SODIUM CHLORIDE 0.9% FLUSH
10.0000 mL | Freq: Once | INTRAVENOUS | Status: AC | PRN
  Administered 2022-09-04: 10 mL
  Filled 2022-09-04: qty 10

## 2022-09-04 MED ORDER — ONDANSETRON HCL 4 MG/2ML IJ SOLN
8.0000 mg | Freq: Once | INTRAMUSCULAR | Status: AC
  Administered 2022-09-04: 8 mg via INTRAVENOUS

## 2022-09-04 MED ORDER — SODIUM CHLORIDE 0.9 % IV SOLN: Freq: Once | INTRAVENOUS | Status: DC

## 2022-09-04 MED ORDER — HEPARIN SOD (PORK) LOCK FLUSH 100 UNIT/ML IV SOLN
500.0000 [IU] | Freq: Once | INTRAVENOUS | Status: AC | PRN
  Administered 2022-09-04: 500 [IU]
  Filled 2022-09-04: qty 5

## 2022-09-04 MED ORDER — SODIUM CHLORIDE 0.9 % IV SOLN
INTRAVENOUS | Status: DC
  Filled 2022-09-04 (×2): qty 250

## 2022-09-06 ENCOUNTER — Inpatient Hospital Stay: Payer: Medicare Other

## 2022-09-06 ENCOUNTER — Other Ambulatory Visit: Payer: Self-pay

## 2022-09-06 VITALS — BP 119/85 | HR 87 | Temp 96.8°F | Resp 20

## 2022-09-06 DIAGNOSIS — E876 Hypokalemia: Secondary | ICD-10-CM

## 2022-09-06 DIAGNOSIS — T451X5A Adverse effect of antineoplastic and immunosuppressive drugs, initial encounter: Secondary | ICD-10-CM

## 2022-09-06 DIAGNOSIS — E860 Dehydration: Secondary | ICD-10-CM

## 2022-09-06 DIAGNOSIS — Z510 Encounter for antineoplastic radiation therapy: Secondary | ICD-10-CM | POA: Diagnosis not present

## 2022-09-06 DIAGNOSIS — C099 Malignant neoplasm of tonsil, unspecified: Secondary | ICD-10-CM

## 2022-09-06 LAB — BASIC METABOLIC PANEL - CANCER CENTER ONLY
Anion gap: 19 — ABNORMAL HIGH (ref 5–15)
BUN: 16 mg/dL (ref 8–23)
CO2: 22 mmol/L (ref 22–32)
Calcium: 9.4 mg/dL (ref 8.9–10.3)
Chloride: 97 mmol/L — ABNORMAL LOW (ref 98–111)
Creatinine: 1.05 mg/dL (ref 0.61–1.24)
GFR, Estimated: >60 mL/min (ref >60)
Glucose, Bld: 99 mg/dL (ref 70–99)
Potassium: 3.2 mmol/L — ABNORMAL LOW (ref 3.5–5.1)
Sodium: 138 mmol/L (ref 135–145)

## 2022-09-06 LAB — MAGNESIUM: Magnesium: 1.7 mg/dL (ref 1.7–2.4)

## 2022-09-06 MED ORDER — SODIUM CHLORIDE 0.9 % IV SOLN: Freq: Once | INTRAVENOUS | Status: DC

## 2022-09-06 MED ORDER — SODIUM CHLORIDE 0.9 % IV SOLN
INTRAVENOUS | Status: DC
  Filled 2022-09-06 (×2): qty 250

## 2022-09-06 MED ORDER — POTASSIUM CHLORIDE 20 MEQ/100ML IV SOLN
20.0000 meq | Freq: Once | INTRAVENOUS | Status: AC
  Administered 2022-09-06: 20 meq via INTRAVENOUS

## 2022-09-06 MED ORDER — HEPARIN SOD (PORK) LOCK FLUSH 100 UNIT/ML IV SOLN
500.0000 [IU] | Freq: Once | INTRAVENOUS | Status: AC | PRN
  Administered 2022-09-06: 500 [IU]
  Filled 2022-09-06: qty 5

## 2022-09-06 MED ORDER — SODIUM CHLORIDE 0.9% FLUSH
10.0000 mL | Freq: Once | INTRAVENOUS | Status: DC | PRN
  Filled 2022-09-06: qty 10

## 2022-09-06 MED ORDER — ONDANSETRON HCL 4 MG/2ML IJ SOLN
8.0000 mg | Freq: Once | INTRAMUSCULAR | Status: AC
  Administered 2022-09-06: 8 mg via INTRAVENOUS
  Filled 2022-09-06: qty 4

## 2022-09-06 NOTE — Progress Notes (Signed)
Nutrition Follow-up:  Patient with stage IV right tonsil cancer, p 16+.  Patient completed last radiation on 7/12 and last chemo was held.    Met with patient during fluids.  Appetite continues to be poor.  Continues with nausea and vomiting.  Was able to eat small amount of mashed potatoes and cabbage yesterday. Also eating a few blueberries. Taking nausea medications   Medications: reviewed  Labs: K 3.2  Anthropometrics:   Weight 134 lb on 7/22 138 lb on 7/17 140 lb on 7/12 152 lb 4.8 oz on 6/27 154 lb 4.8 oz on 6/20 160 lb 1.6 oz on 6/7 159 lb 6.4 oz on 5/31 169 lb 9.6 oz on 5/23 170 lb on 5/15   NUTRITION DIAGNOSIS: Predicted sub optimal energy intake continues    INTERVENTION:  Samples of ensure clear provided (blueberry pomegrante flavor as patient can taste blueberries.   Recipes for nausea given and handout on foods to choose. Encouraged nibbling/sipping on liquids     MONITORING, EVALUATION, GOAL: weight trends, intake   NEXT VISIT: ~1 week  Jadin Creque B. Freida Busman, RD, LDN Registered Dietitian [phone]

## 2022-09-09 ENCOUNTER — Inpatient Hospital Stay: Payer: Medicare Other

## 2022-09-09 ENCOUNTER — Encounter: Payer: Self-pay | Admitting: Nurse Practitioner

## 2022-09-09 ENCOUNTER — Inpatient Hospital Stay (HOSPITAL_BASED_OUTPATIENT_CLINIC_OR_DEPARTMENT_OTHER): Payer: Medicare Other | Admitting: Nurse Practitioner

## 2022-09-09 VITALS — BP 120/89 | HR 98 | Temp 96.9°F | Wt 131.0 lb

## 2022-09-09 DIAGNOSIS — C099 Malignant neoplasm of tonsil, unspecified: Secondary | ICD-10-CM

## 2022-09-09 DIAGNOSIS — R110 Nausea: Secondary | ICD-10-CM

## 2022-09-09 DIAGNOSIS — E876 Hypokalemia: Secondary | ICD-10-CM

## 2022-09-09 DIAGNOSIS — T66XXXA Radiation sickness, unspecified, initial encounter: Secondary | ICD-10-CM

## 2022-09-09 DIAGNOSIS — E860 Dehydration: Secondary | ICD-10-CM

## 2022-09-09 DIAGNOSIS — K208 Other esophagitis without bleeding: Secondary | ICD-10-CM | POA: Diagnosis not present

## 2022-09-09 DIAGNOSIS — Z510 Encounter for antineoplastic radiation therapy: Secondary | ICD-10-CM | POA: Diagnosis not present

## 2022-09-09 DIAGNOSIS — T451X5A Adverse effect of antineoplastic and immunosuppressive drugs, initial encounter: Secondary | ICD-10-CM

## 2022-09-09 LAB — BASIC METABOLIC PANEL - CANCER CENTER ONLY
Anion gap: 15 (ref 5–15)
BUN: 17 mg/dL (ref 8–23)
CO2: 26 mmol/L (ref 22–32)
Calcium: 9.4 mg/dL (ref 8.9–10.3)
Chloride: 97 mmol/L — ABNORMAL LOW (ref 98–111)
Creatinine: 0.97 mg/dL (ref 0.61–1.24)
GFR, Estimated: >60 mL/min (ref >60)
Glucose, Bld: 111 mg/dL — ABNORMAL HIGH (ref 70–99)
Potassium: 3.2 mmol/L — ABNORMAL LOW (ref 3.5–5.1)
Sodium: 138 mmol/L (ref 135–145)

## 2022-09-09 LAB — CBC WITH DIFFERENTIAL/PLATELET
Abs Immature Granulocytes: 0.03 10*3/uL (ref 0.00–0.07)
Basophils Absolute: 0 10*3/uL (ref 0.0–0.1)
Basophils Relative: 0 %
Eosinophils Absolute: 0 10*3/uL (ref 0.0–0.5)
Eosinophils Relative: 0 %
HCT: 34.1 % — ABNORMAL LOW (ref 39.0–52.0)
Hemoglobin: 11.5 g/dL — ABNORMAL LOW (ref 13.0–17.0)
Immature Granulocytes: 1 %
Lymphocytes Relative: 24 %
Lymphs Abs: 0.9 10*3/uL (ref 0.7–4.0)
MCH: 26.6 pg (ref 26.0–34.0)
MCHC: 33.7 g/dL (ref 30.0–36.0)
MCV: 78.8 fL — ABNORMAL LOW (ref 80.0–100.0)
Monocytes Absolute: 0.4 10*3/uL (ref 0.1–1.0)
Monocytes Relative: 10 %
Neutro Abs: 2.4 10*3/uL (ref 1.7–7.7)
Neutrophils Relative %: 65 %
Platelets: 194 10*3/uL (ref 150–400)
RBC: 4.33 MIL/uL (ref 4.22–5.81)
RDW: 20.1 % — ABNORMAL HIGH (ref 11.5–15.5)
WBC: 3.7 10*3/uL — ABNORMAL LOW (ref 4.0–10.5)
nRBC: 0 % (ref 0.0–0.2)

## 2022-09-09 LAB — MAGNESIUM: Magnesium: 1.6 mg/dL — ABNORMAL LOW (ref 1.7–2.4)

## 2022-09-09 MED ORDER — ONDANSETRON HCL 4 MG/2ML IJ SOLN
8.0000 mg | Freq: Once | INTRAMUSCULAR | Status: AC
  Administered 2022-09-09: 8 mg via INTRAVENOUS

## 2022-09-09 MED ORDER — SODIUM CHLORIDE 0.9 % IV SOLN
INTRAVENOUS | Status: DC
  Filled 2022-09-09 (×2): qty 250

## 2022-09-09 MED ORDER — HEPARIN SOD (PORK) LOCK FLUSH 100 UNIT/ML IV SOLN
500.0000 [IU] | Freq: Once | INTRAVENOUS | Status: AC | PRN
  Administered 2022-09-09: 500 [IU]
  Filled 2022-09-09: qty 5

## 2022-09-09 MED ORDER — OMEPRAZOLE 20 MG PO CPDR: 20.0000 mg | DELAYED_RELEASE_CAPSULE | Freq: Every day | ORAL | Status: DC

## 2022-09-09 MED ORDER — POTASSIUM CHLORIDE 20 MEQ/100ML IV SOLN
20.0000 meq | Freq: Once | INTRAVENOUS | Status: AC
  Administered 2022-09-09: 20 meq via INTRAVENOUS

## 2022-09-09 MED ORDER — SODIUM CHLORIDE 0.9% FLUSH
10.0000 mL | Freq: Once | INTRAVENOUS | Status: AC | PRN
  Administered 2022-09-09: 10 mL
  Filled 2022-09-09: qty 10

## 2022-09-09 MED ORDER — ONDANSETRON HCL 4 MG/2ML IJ SOLN
INTRAMUSCULAR | Status: AC
  Filled 2022-09-09: qty 4
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
## 2022-09-11 ENCOUNTER — Inpatient Hospital Stay: Payer: Medicare Other

## 2022-09-11 VITALS — BP 120/70 | HR 105 | Temp 96.7°F | Resp 20

## 2022-09-11 DIAGNOSIS — Z510 Encounter for antineoplastic radiation therapy: Secondary | ICD-10-CM | POA: Diagnosis not present

## 2022-09-11 DIAGNOSIS — C099 Malignant neoplasm of tonsil, unspecified: Secondary | ICD-10-CM

## 2022-09-11 DIAGNOSIS — T451X5A Adverse effect of antineoplastic and immunosuppressive drugs, initial encounter: Secondary | ICD-10-CM

## 2022-09-11 DIAGNOSIS — E860 Dehydration: Secondary | ICD-10-CM

## 2022-09-11 LAB — BASIC METABOLIC PANEL - CANCER CENTER ONLY
Anion gap: 14 (ref 5–15)
BUN: 17 mg/dL (ref 8–23)
CO2: 25 mmol/L (ref 22–32)
Calcium: 9.2 mg/dL (ref 8.9–10.3)
Chloride: 96 mmol/L — ABNORMAL LOW (ref 98–111)
Creatinine: 0.88 mg/dL (ref 0.61–1.24)
GFR, Estimated: >60 mL/min (ref >60)
Glucose, Bld: 99 mg/dL (ref 70–99)
Potassium: 3.3 mmol/L — ABNORMAL LOW (ref 3.5–5.1)
Sodium: 135 mmol/L (ref 135–145)

## 2022-09-11 MED ORDER — ONDANSETRON HCL 4 MG/2ML IJ SOLN: 8.0000 mg | Freq: Once | INTRAMUSCULAR | Status: DC

## 2022-09-11 MED ORDER — SODIUM CHLORIDE 0.9 % IV SOLN: Freq: Once | INTRAVENOUS | Status: DC

## 2022-09-11 MED ORDER — SODIUM CHLORIDE 0.9% FLUSH
10.0000 mL | Freq: Once | INTRAVENOUS | Status: AC | PRN
  Administered 2022-09-11: 10 mL
  Filled 2022-09-11: qty 10

## 2022-09-11 MED ORDER — SODIUM CHLORIDE 0.9 % IV SOLN
INTRAVENOUS | Status: DC
  Filled 2022-09-11 (×2): qty 250

## 2022-09-11 MED ORDER — HEPARIN SOD (PORK) LOCK FLUSH 100 UNIT/ML IV SOLN
500.0000 [IU] | Freq: Once | INTRAVENOUS | Status: AC | PRN
  Administered 2022-09-11: 500 [IU]
  Filled 2022-09-11: qty 5

## 2022-09-13 ENCOUNTER — Inpatient Hospital Stay: Payer: Medicare Other

## 2022-09-13 ENCOUNTER — Ambulatory Visit: Payer: Medicare Other

## 2022-09-13 ENCOUNTER — Inpatient Hospital Stay: Payer: Medicare Other | Attending: Oncology

## 2022-09-13 VITALS — BP 120/72 | HR 77 | Temp 97.0°F | Resp 20

## 2022-09-13 DIAGNOSIS — Z87891 Personal history of nicotine dependence: Secondary | ICD-10-CM | POA: Insufficient documentation

## 2022-09-13 DIAGNOSIS — E781 Pure hyperglyceridemia: Secondary | ICD-10-CM | POA: Insufficient documentation

## 2022-09-13 DIAGNOSIS — R635 Abnormal weight gain: Secondary | ICD-10-CM | POA: Insufficient documentation

## 2022-09-13 DIAGNOSIS — C099 Malignant neoplasm of tonsil, unspecified: Secondary | ICD-10-CM | POA: Insufficient documentation

## 2022-09-13 DIAGNOSIS — Z806 Family history of leukemia: Secondary | ICD-10-CM | POA: Diagnosis not present

## 2022-09-13 DIAGNOSIS — R110 Nausea: Secondary | ICD-10-CM | POA: Insufficient documentation

## 2022-09-13 DIAGNOSIS — R200 Anesthesia of skin: Secondary | ICD-10-CM | POA: Insufficient documentation

## 2022-09-13 DIAGNOSIS — R197 Diarrhea, unspecified: Secondary | ICD-10-CM | POA: Diagnosis not present

## 2022-09-13 DIAGNOSIS — E860 Dehydration: Secondary | ICD-10-CM

## 2022-09-13 DIAGNOSIS — R202 Paresthesia of skin: Secondary | ICD-10-CM | POA: Insufficient documentation

## 2022-09-13 DIAGNOSIS — Z79899 Other long term (current) drug therapy: Secondary | ICD-10-CM | POA: Diagnosis not present

## 2022-09-13 DIAGNOSIS — E876 Hypokalemia: Secondary | ICD-10-CM | POA: Insufficient documentation

## 2022-09-13 DIAGNOSIS — Z8619 Personal history of other infectious and parasitic diseases: Secondary | ICD-10-CM | POA: Insufficient documentation

## 2022-09-13 DIAGNOSIS — I700 Atherosclerosis of aorta: Secondary | ICD-10-CM | POA: Insufficient documentation

## 2022-09-13 DIAGNOSIS — T451X5A Adverse effect of antineoplastic and immunosuppressive drugs, initial encounter: Secondary | ICD-10-CM

## 2022-09-13 DIAGNOSIS — N62 Hypertrophy of breast: Secondary | ICD-10-CM | POA: Insufficient documentation

## 2022-09-13 DIAGNOSIS — D649 Anemia, unspecified: Secondary | ICD-10-CM | POA: Insufficient documentation

## 2022-09-13 DIAGNOSIS — Z87442 Personal history of urinary calculi: Secondary | ICD-10-CM | POA: Insufficient documentation

## 2022-09-13 DIAGNOSIS — N644 Mastodynia: Secondary | ICD-10-CM | POA: Insufficient documentation

## 2022-09-13 LAB — BASIC METABOLIC PANEL - CANCER CENTER ONLY
Anion gap: 14 (ref 5–15)
BUN: 15 mg/dL (ref 8–23)
CO2: 26 mmol/L (ref 22–32)
Calcium: 9.3 mg/dL (ref 8.9–10.3)
Chloride: 97 mmol/L — ABNORMAL LOW (ref 98–111)
Creatinine: 0.87 mg/dL (ref 0.61–1.24)
GFR, Estimated: >60 mL/min (ref >60)
Glucose, Bld: 118 mg/dL — ABNORMAL HIGH (ref 70–99)
Potassium: 3.1 mmol/L — ABNORMAL LOW (ref 3.5–5.1)
Sodium: 137 mmol/L (ref 135–145)

## 2022-09-13 MED ORDER — SODIUM CHLORIDE 0.9% FLUSH
10.0000 mL | Freq: Once | INTRAVENOUS | Status: AC | PRN
  Administered 2022-09-13: 10 mL
  Filled 2022-09-13: qty 10

## 2022-09-13 MED ORDER — HEPARIN SOD (PORK) LOCK FLUSH 100 UNIT/ML IV SOLN
500.0000 [IU] | Freq: Once | INTRAVENOUS | Status: AC | PRN
  Administered 2022-09-13: 500 [IU]
  Filled 2022-09-13: qty 5

## 2022-09-13 MED ORDER — POTASSIUM CHLORIDE 20 MEQ/100ML IV SOLN
20.0000 meq | Freq: Once | INTRAVENOUS | Status: AC
  Administered 2022-09-13: 20 meq via INTRAVENOUS

## 2022-09-13 MED ORDER — SODIUM CHLORIDE 0.9 % IV SOLN
INTRAVENOUS | Status: DC
  Filled 2022-09-13 (×2): qty 250

## 2022-09-13 NOTE — Progress Notes (Signed)
Nutrition  Patient finished up with fluids before RD could see.    Called patient but no answer or option to leave voicemail.    Will follow-up   B. Freida Busman, RD, LDN Registered Dietitian [phone]

## 2022-09-16 ENCOUNTER — Inpatient Hospital Stay: Payer: Medicare Other

## 2022-09-16 VITALS — BP 121/75 | HR 76 | Temp 96.2°F | Resp 20

## 2022-09-16 DIAGNOSIS — C099 Malignant neoplasm of tonsil, unspecified: Secondary | ICD-10-CM

## 2022-09-16 DIAGNOSIS — E860 Dehydration: Secondary | ICD-10-CM

## 2022-09-16 DIAGNOSIS — T451X5A Adverse effect of antineoplastic and immunosuppressive drugs, initial encounter: Secondary | ICD-10-CM

## 2022-09-16 DIAGNOSIS — E876 Hypokalemia: Secondary | ICD-10-CM

## 2022-09-16 LAB — BASIC METABOLIC PANEL - CANCER CENTER ONLY
Anion gap: 11 (ref 5–15)
BUN: 19 mg/dL (ref 8–23)
CO2: 29 mmol/L (ref 22–32)
Calcium: 9.2 mg/dL (ref 8.9–10.3)
Chloride: 95 mmol/L — ABNORMAL LOW (ref 98–111)
Creatinine: 0.92 mg/dL (ref 0.61–1.24)
GFR, Estimated: >60 mL/min (ref >60)
Glucose, Bld: 143 mg/dL — ABNORMAL HIGH (ref 70–99)
Potassium: 3 mmol/L — ABNORMAL LOW (ref 3.5–5.1)
Sodium: 135 mmol/L (ref 135–145)

## 2022-09-16 MED ORDER — HEPARIN SOD (PORK) LOCK FLUSH 100 UNIT/ML IV SOLN
500.0000 [IU] | Freq: Once | INTRAVENOUS | Status: AC
  Administered 2022-09-16: 500 [IU] via INTRAVENOUS
  Filled 2022-09-16: qty 5

## 2022-09-16 MED ORDER — POTASSIUM CHLORIDE 20 MEQ/100ML IV SOLN
20.0000 meq | Freq: Once | INTRAVENOUS | Status: AC
  Administered 2022-09-16: 20 meq via INTRAVENOUS

## 2022-09-16 MED ORDER — SODIUM CHLORIDE 0.9 % IV SOLN
INTRAVENOUS | Status: DC
  Filled 2022-09-16 (×2): qty 250

## 2022-09-16 MED ORDER — SODIUM CHLORIDE 0.9% FLUSH
10.0000 mL | Freq: Once | INTRAVENOUS | Status: AC
  Administered 2022-09-16: 10 mL via INTRAVENOUS
  Filled 2022-09-16: qty 10

## 2022-09-17 ENCOUNTER — Telehealth: Payer: Self-pay | Admitting: Oncology

## 2022-09-17 ENCOUNTER — Telehealth: Payer: Self-pay | Admitting: *Deleted

## 2022-09-17 MED ORDER — NYSTATIN 100000 UNIT/ML MT SUSP: 5.0000 mL | Freq: Four times a day (QID) | OROMUCOSAL | 0 refills | Status: DC

## 2022-09-17 NOTE — Telephone Encounter (Signed)
Requesting refill on nystatin mouthwash.  States still has thrush and using regularly.

## 2022-09-17 NOTE — Telephone Encounter (Signed)
Patients niece called and is saying he is out of the mouthwash and needs a new prescription sent to the pharmacy. I told her I would reachout.

## 2022-09-18 ENCOUNTER — Inpatient Hospital Stay: Payer: Medicare Other

## 2022-09-18 ENCOUNTER — Ambulatory Visit: Payer: Medicare Other

## 2022-09-18 VITALS — BP 132/82 | HR 68 | Resp 20

## 2022-09-18 DIAGNOSIS — C099 Malignant neoplasm of tonsil, unspecified: Secondary | ICD-10-CM | POA: Diagnosis not present

## 2022-09-18 DIAGNOSIS — E876 Hypokalemia: Secondary | ICD-10-CM

## 2022-09-18 DIAGNOSIS — E860 Dehydration: Secondary | ICD-10-CM

## 2022-09-18 DIAGNOSIS — T451X5A Adverse effect of antineoplastic and immunosuppressive drugs, initial encounter: Secondary | ICD-10-CM

## 2022-09-18 LAB — BASIC METABOLIC PANEL - CANCER CENTER ONLY
Anion gap: 9 (ref 5–15)
BUN: 19 mg/dL (ref 8–23)
CO2: 27 mmol/L (ref 22–32)
Calcium: 9.1 mg/dL (ref 8.9–10.3)
Chloride: 99 mmol/L (ref 98–111)
Creatinine: 1.04 mg/dL (ref 0.61–1.24)
GFR, Estimated: >60 mL/min (ref >60)
Glucose, Bld: 130 mg/dL — ABNORMAL HIGH (ref 70–99)
Potassium: 3.3 mmol/L — ABNORMAL LOW (ref 3.5–5.1)
Sodium: 135 mmol/L (ref 135–145)

## 2022-09-18 MED ORDER — SODIUM CHLORIDE 0.9 % IV SOLN
INTRAVENOUS | Status: DC
  Filled 2022-09-18 (×2): qty 250

## 2022-09-18 MED ORDER — SODIUM CHLORIDE 0.9% FLUSH
10.0000 mL | Freq: Once | INTRAVENOUS | Status: AC | PRN
  Administered 2022-09-18: 10 mL
  Filled 2022-09-18: qty 10

## 2022-09-18 MED ORDER — HEPARIN SOD (PORK) LOCK FLUSH 100 UNIT/ML IV SOLN
500.0000 [IU] | Freq: Once | INTRAVENOUS | Status: AC | PRN
  Administered 2022-09-18: 500 [IU]
  Filled 2022-09-18: qty 5

## 2022-09-18 NOTE — Progress Notes (Signed)
K 3.3 today. Pt reports increased appetite over past couple of days. Met with dietician Alphonse Guild and discussed potassium rich foods. Pt prefers to not get IV K today and include potassium rich foods to see how that does. Pt to return to clinic on Friday for recheck of labs and IVF

## 2022-09-18 NOTE — Progress Notes (Signed)
Nutrition Follow-up:  Patient with stage IV right tonsil, p 16+.  Patient completed radiation on 7/12 and last chemo was held.    Received call from niece Archie Patten as had not heard from pharmacy regarding nystatin refill.  Refill was sent to [facility] pharmacy on [address]  Late yesterday afternoon.  Niece was anticipating refill at [facility] in [place].     Met with patient during IV fluids.  Reports that he has not taken nausea medication in the last 5 days.  Has been eating more orally and tolerating.  Yesterday able to eat macaroni and cheese, cold cereal, fruit, white beans and homemade biscuits.  This morning was able to eat egg and cheese on hot dog bun for breakfast with watermelon. Unable to tolerate meat right now.   Reports thick saliva continues and he is coughing it up. Feeling better but still weak.      Medications: reviewed  Labs: K 3.3   Anthropometrics:   Weight 131 lb on 7/29 134 lb on 7/22 138 lb on 7/17 140 lb on 7/12 152 lb on 6/27 154 lb on 6/20 160 lb on 6/7 159 lb on 5/31 169 lb on 5/23 170 lb on 5/15   NUTRITION DIAGNOSIS: Predicted sub optimal energy intake starting to improve   INTERVENTION:  Congratulated patient on no nausea medication and starting to eat!   Patient wants to try 1 new food each day to see if he can tolerate.   Discussed foods other than meats that have protein (beans, eggs, cheese and dairy products) Would continue IV fluid sessions until seen by MD on 8/14.  Discussed with patient.     MONITORING, EVALUATION, GOAL: weight trends, intake   NEXT VISIT: phone call Tuesday, August 13    B. Freida Busman, RD, LDN Registered Dietitian [phone]

## 2022-09-18 NOTE — Patient Instructions (Signed)
Hypokalemia Hypokalemia means that the amount of potassium in the blood is lower than normal. Potassium is a mineral (electrolyte) that helps regulate the amount of fluid in the body. It also stimulates muscle tightening (contraction) and helps nerves work properly. Normally, most of the body's potassium is inside cells, and only a very small amount is in the blood. Because the amount in the blood is so small, minor changes to potassium levels in the blood can be life-threatening. What are the causes? This condition may be caused by: Antibiotic medicine. Diarrhea or vomiting. Taking too much of a medicine that helps you have a bowel movement (laxative) can cause diarrhea and lead to hypokalemia. Chronic kidney disease (CKD). Medicines that help the body get rid of excess fluid (diuretics). Eating disorders, such as anorexia or bulimia. Low magnesium levels in the body. Sweating a lot. What are the signs or symptoms? Symptoms of this condition include: Weakness. Constipation. Fatigue. Muscle cramps. Mental confusion. Skipped heartbeats or irregular heartbeat (palpitations). Tingling or numbness. How is this diagnosed? This condition is diagnosed with a blood test. How is this treated? This condition may be treated by: Taking potassium supplements. Adjusting the medicines that you take. Eating more foods that contain a lot of potassium. If your potassium level is very low, you may need to get potassium through an IV and be monitored in the hospital. Follow these instructions at home: Eating and drinking  Eat a healthy diet. A healthy diet includes fresh fruits and vegetables, whole grains, healthy fats, and lean proteins. If told, eat more foods that contain a lot of potassium. These include: Nuts, such as peanuts and pistachios. Seeds, such as sunflower seeds and pumpkin seeds. Peas, lentils, and lima beans. Whole grain and bran cereals and breads. Fresh fruits and vegetables,  such as apricots, avocado, bananas, cantaloupe, kiwi, oranges, tomatoes, asparagus, and potatoes. Juices, such as orange, tomato, and prune. Lean meats, including fish. Milk and milk products, such as yogurt. General instructions Take over-the-counter and prescription medicines only as told by your health care provider. This includes vitamins, natural food products, and supplements. Keep all follow-up visits. This is important. Contact a health care provider if: You have weakness that gets worse. You feel your heart pounding or racing. You vomit. You have diarrhea. You have diabetes and you have trouble keeping your blood sugar in your target range. Get help right away if: You have chest pain. You have shortness of breath. You have vomiting or diarrhea that lasts for more than 2 days. You faint. These symptoms may be an emergency. Get help right away. Call 911. Do not wait to see if the symptoms will go away. Do not drive yourself to the hospital. Summary Hypokalemia means that the amount of potassium in the blood is lower than normal. This condition is diagnosed with a blood test. Hypokalemia may be treated by taking potassium supplements, adjusting the medicines that you take, or eating more foods that are high in potassium. If your potassium level is very low, you may need to get potassium through an IV and be monitored in the hospital. This information is not intended to replace advice given to you by your health care provider. Make sure you discuss any questions you have with your health care provider. Document Revised: 10/12/2020 Document Reviewed: 10/12/2020 Elsevier Patient Education  2024 Elsevier Inc.  

## 2022-09-20 ENCOUNTER — Inpatient Hospital Stay: Payer: Medicare Other

## 2022-09-20 VITALS — BP 101/66 | HR 76 | Temp 96.9°F | Resp 20

## 2022-09-20 DIAGNOSIS — E860 Dehydration: Secondary | ICD-10-CM

## 2022-09-20 DIAGNOSIS — C099 Malignant neoplasm of tonsil, unspecified: Secondary | ICD-10-CM | POA: Diagnosis not present

## 2022-09-20 DIAGNOSIS — R110 Nausea: Secondary | ICD-10-CM

## 2022-09-20 LAB — BASIC METABOLIC PANEL - CANCER CENTER ONLY
Anion gap: 8 (ref 5–15)
BUN: 24 mg/dL — ABNORMAL HIGH (ref 8–23)
CO2: 25 mmol/L (ref 22–32)
Calcium: 8.8 mg/dL — ABNORMAL LOW (ref 8.9–10.3)
Chloride: 102 mmol/L (ref 98–111)
Creatinine: 0.9 mg/dL (ref 0.61–1.24)
GFR, Estimated: >60 mL/min (ref >60)
Glucose, Bld: 132 mg/dL — ABNORMAL HIGH (ref 70–99)
Potassium: 4 mmol/L (ref 3.5–5.1)
Sodium: 135 mmol/L (ref 135–145)

## 2022-09-20 MED ORDER — SODIUM CHLORIDE 0.9% FLUSH
10.0000 mL | Freq: Once | INTRAVENOUS | Status: AC | PRN
  Administered 2022-09-20: 10 mL
  Filled 2022-09-20: qty 10

## 2022-09-20 MED ORDER — SODIUM CHLORIDE 0.9 % IV SOLN
INTRAVENOUS | Status: DC
  Filled 2022-09-20 (×2): qty 250

## 2022-09-20 MED ORDER — HEPARIN SOD (PORK) LOCK FLUSH 100 UNIT/ML IV SOLN
500.0000 [IU] | Freq: Once | INTRAVENOUS | Status: AC | PRN
  Administered 2022-09-20: 500 [IU]
  Filled 2022-09-20: qty 5

## 2022-09-23 ENCOUNTER — Inpatient Hospital Stay: Payer: Medicare Other

## 2022-09-24 ENCOUNTER — Inpatient Hospital Stay: Payer: Medicare Other

## 2022-09-24 NOTE — Progress Notes (Signed)
Nutrition Follow-up:  Patient with stage IV SCC of right tonsil, p 16+.  Patient completed radiation on 7/12 and last chemo was held.  Spoke with patient via phone for nutrition follow-up.  Patient reports that he has been eating from day light until dark.  This am ate 2 fried eggs with grits and 2 pieces of toast with tomato.  Has been eating fruit and cheese and planning pinto beans with corn bread for dinner tonight.  Has been able to tolerate pepperoni pizza and hamburger over the weekend.  Also had fried flounder with salad and french fries.  Denies nausea.  Taste is coming back.  Has been trying to walk more.      Medications: reviewed  Labs: 8/9 glucose 132, BUN 24, creatinine 0.90, K 4.0 WNL,   Anthropometrics:   Weight 131 lb on 7/29 134 lb on 7/22 138 lb on 7/17 140 lb on 7/12 152 lb on 6/27 154 lb on 6/20 160 lb on 6/7 159 lb on 5/31 169 lb on 5/23 170 lb on 5/15   NUTRITION DIAGNOSIS: Predicted sub optimal energy intake improving   INTERVENTION:  Continue to push calories and protein to promote weight gain.      MONITORING, EVALUATION, GOAL: weight trends, intake   NEXT VISIT: phone call, Tuesday, Sept 10   B. Freida Busman, RD, LDN Registered Dietitian [phone]

## 2022-09-25 ENCOUNTER — Ambulatory Visit
Admission: RE | Admit: 2022-09-25 | Discharge: 2022-09-25 | Disposition: A | Discharge status: Home / Self Care | Payer: Medicare Other | Source: Ambulatory Visit | Attending: Radiation Oncology | Admitting: Radiation Oncology

## 2022-09-25 ENCOUNTER — Inpatient Hospital Stay (HOSPITAL_BASED_OUTPATIENT_CLINIC_OR_DEPARTMENT_OTHER): Payer: Medicare Other | Admitting: Oncology

## 2022-09-25 ENCOUNTER — Encounter: Payer: Self-pay | Admitting: Oncology

## 2022-09-25 ENCOUNTER — Other Ambulatory Visit: Payer: Self-pay | Admitting: *Deleted

## 2022-09-25 ENCOUNTER — Inpatient Hospital Stay: Payer: Medicare Other

## 2022-09-25 VITALS — BP 115/73 | HR 82 | Temp 97.0°F | Resp 18 | Wt 139.5 lb

## 2022-09-25 DIAGNOSIS — O26819 Pregnancy related exhaustion and fatigue, unspecified trimester: Secondary | ICD-10-CM

## 2022-09-25 DIAGNOSIS — Z1329 Encounter for screening for other suspected endocrine disorder: Secondary | ICD-10-CM

## 2022-09-25 DIAGNOSIS — C099 Malignant neoplasm of tonsil, unspecified: Secondary | ICD-10-CM

## 2022-09-25 DIAGNOSIS — D649 Anemia, unspecified: Secondary | ICD-10-CM

## 2022-09-25 DIAGNOSIS — E876 Hypokalemia: Secondary | ICD-10-CM

## 2022-09-25 DIAGNOSIS — G620 Drug-induced polyneuropathy: Secondary | ICD-10-CM

## 2022-09-25 DIAGNOSIS — T451X5A Adverse effect of antineoplastic and immunosuppressive drugs, initial encounter: Secondary | ICD-10-CM

## 2022-09-25 DIAGNOSIS — Z923 Personal history of irradiation: Secondary | ICD-10-CM | POA: Diagnosis not present

## 2022-09-25 LAB — CBC WITH DIFFERENTIAL (CANCER CENTER ONLY)
Abs Immature Granulocytes: 0.06 10*3/uL (ref 0.00–0.07)
Basophils Absolute: 0 10*3/uL (ref 0.0–0.1)
Basophils Relative: 0 %
Eosinophils Absolute: 0 10*3/uL (ref 0.0–0.5)
Eosinophils Relative: 1 %
HCT: 28.3 % — ABNORMAL LOW (ref 39.0–52.0)
Hemoglobin: 9.1 g/dL — ABNORMAL LOW (ref 13.0–17.0)
Immature Granulocytes: 2 %
Lymphocytes Relative: 29 %
Lymphs Abs: 1.2 10*3/uL (ref 0.7–4.0)
MCH: 27.9 pg (ref 26.0–34.0)
MCHC: 32.2 g/dL (ref 30.0–36.0)
MCV: 86.8 fL (ref 80.0–100.0)
Monocytes Absolute: 0.5 10*3/uL (ref 0.1–1.0)
Monocytes Relative: 11 %
Neutro Abs: 2.3 10*3/uL (ref 1.7–7.7)
Neutrophils Relative %: 57 %
Platelet Count: 221 10*3/uL (ref 150–400)
RBC: 3.26 MIL/uL — ABNORMAL LOW (ref 4.22–5.81)
RDW: 23.9 % — ABNORMAL HIGH (ref 11.5–15.5)
WBC Count: 4 10*3/uL (ref 4.0–10.5)
nRBC: 0 % (ref 0.0–0.2)

## 2022-09-25 LAB — CMP (CANCER CENTER ONLY)
ALT: 32 U/L (ref 0–44)
AST: 30 U/L (ref 15–41)
Albumin: 3.4 g/dL — ABNORMAL LOW (ref 3.5–5.0)
Alkaline Phosphatase: 64 U/L (ref 38–126)
Anion gap: 8 (ref 5–15)
BUN: 28 mg/dL — ABNORMAL HIGH (ref 8–23)
CO2: 23 mmol/L (ref 22–32)
Calcium: 9.4 mg/dL (ref 8.9–10.3)
Chloride: 102 mmol/L (ref 98–111)
Creatinine: 0.92 mg/dL (ref 0.61–1.24)
GFR, Estimated: >60 mL/min (ref >60)
Glucose, Bld: 151 mg/dL — ABNORMAL HIGH (ref 70–99)
Potassium: 4.5 mmol/L (ref 3.5–5.1)
Sodium: 133 mmol/L — ABNORMAL LOW (ref 135–145)
Total Bilirubin: 0.5 mg/dL (ref 0.3–1.2)
Total Protein: 6.3 g/dL — ABNORMAL LOW (ref 6.5–8.1)

## 2022-09-25 LAB — MAGNESIUM: Magnesium: 1.4 mg/dL — ABNORMAL LOW (ref 1.7–2.4)

## 2022-09-25 MED ORDER — HEPARIN SOD (PORK) LOCK FLUSH 100 UNIT/ML IV SOLN
500.0000 [IU] | Freq: Once | INTRAVENOUS | Status: AC
  Administered 2022-09-25: 500 [IU] via INTRAVENOUS
  Filled 2022-09-25: qty 5

## 2022-09-25 MED ORDER — MAGNESIUM CHLORIDE 64 MG PO TBEC: 1.0000 | DELAYED_RELEASE_TABLET | Freq: Every day | ORAL | 1 refills | Status: DC

## 2022-09-25 MED ORDER — SODIUM CHLORIDE 0.9% FLUSH
10.0000 mL | Freq: Once | INTRAVENOUS | Status: AC
  Administered 2022-09-25: 10 mL via INTRAVENOUS
  Filled 2022-09-25: qty 10

## 2022-09-25 MED ORDER — MAGNESIUM SULFATE 2 GM/50ML IV SOLN
2.0000 g | Freq: Once | INTRAVENOUS | Status: AC
  Administered 2022-09-25: 2 g via INTRAVENOUS
  Filled 2022-09-25: qty 50

## 2022-09-25 NOTE — Assessment & Plan Note (Addendum)
Biopsy and PET scan results were reviewed and discussed with patient. 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  Pending ENT evaluation. P16 positive. Status post concurrent chemotherapy cisplatin and radiation Clinically patient has been doing better.  No need for IV fluids session. Follow up with ENT 8 weeks after radiation.  Repeat PET scan in 10-12 weeks for evaluation treatment response.

## 2022-09-25 NOTE — Assessment & Plan Note (Signed)
IV magnesium 2 g x 1. Recommend patient to start Slow-Mag 64 mg daily.  Prescription sent to pharmacy

## 2022-09-25 NOTE — Progress Notes (Signed)
Pt here for follow up. Pt has had 8 pound weight gain since 2 weeks ago. He states he is eating very well

## 2022-09-25 NOTE — Assessment & Plan Note (Signed)
Grade 1 Observation. Keep feet warm.  

## 2022-09-25 NOTE — Patient Instructions (Signed)
Hypomagnesemia Hypomagnesemia is a condition in which the level of magnesium in the blood is too low. Magnesium is a mineral that is found in many foods. It is used in many different processes in the body. Hypomagnesemia can affect every organ in the body. In severe cases, it can cause life-threatening problems. What are the causes? This condition may be caused by: Not getting enough magnesium in your diet or not having enough healthy foods to eat (malnutrition). Problems with magnesium absorption in the intestines. Dehydration. Excessive use of alcohol. Vomiting. Severe or long-term (chronic) diarrhea. Some medicines, including medicines that make you urinate more often (diuretics). Certain diseases, such as kidney disease, diabetes, celiac disease, and overactive thyroid. What are the signs or symptoms? Symptoms of this condition include: Loss of appetite, nausea, and vomiting. Involuntary shaking or trembling of a body part (tremor). Muscle weakness or tingling in the arms and legs. Sudden tightening of muscles (muscle spasms). Confusion. Psychiatric issues, such as: Depression and irritability. Psychosis. A feeling of fluttering of the heart (palpitations). Seizures. These symptoms are more severe if magnesium levels drop suddenly. How is this diagnosed? This condition may be diagnosed based on: Your symptoms and medical history. A physical exam. Blood and urine tests. How is this treated? Treatment depends on the cause and the severity of the condition. It may be treated by: Taking a magnesium supplement. This can be taken in pill form. If the condition is severe, magnesium is usually given through an IV. Making changes to your diet. You may be directed to eat foods that have a lot of magnesium, such as green leafy vegetables, peas, beans, and nuts. Not drinking alcohol. If you are struggling not to drink, ask your health care provider for help. Follow these instructions at  home: Eating and drinking     Make sure that your diet includes foods with magnesium. Foods that have a lot of magnesium in them include: Green leafy vegetables, such as spinach and broccoli. Beans and peas. Nuts and seeds, such as almonds and sunflower seeds. Whole grains, such as whole grain bread and fortified cereals. Drink fluids that contain salts and minerals (electrolytes), such as sports drinks, when you are active. Do not drink alcohol. General instructions Take over-the-counter and prescription medicines only as told by your health care provider. Take magnesium supplements as directed if your health care provider tells you to take them. Have your magnesium levels monitored as told by your health care provider. Keep all follow-up visits. This is important. Contact a health care provider if: You get worse instead of better. Your symptoms return. Get help right away if: You develop severe muscle weakness. You have trouble breathing. You feel that your heart is racing. These symptoms may represent a serious problem that is an emergency. Do not wait to see if the symptoms will go away. Get medical help right away. Call your local emergency services (911 in the [place] Do not drive yourself to the hospital. Summary Hypomagnesemia is a condition in which the level of magnesium in the blood is too low. Hypomagnesemia can affect every organ in the body. Treatment may include eating more foods that contain magnesium, taking magnesium supplements, and not drinking alcohol. Have your magnesium levels monitored as told by your health care provider. This information is not intended to replace advice given to you by your health care provider. Make sure you discuss any questions you have with your health care provider. Document Revised: 06/27/2020 Document Reviewed: 06/27/2020 Elsevier Patient Education    2024 Elsevier Inc.  

## 2022-09-25 NOTE — Assessment & Plan Note (Signed)
Decreased, likely secondary to marrow suppression from chemotherapy/radiation. Repeat CBC in 3 weeks.

## 2022-09-25 NOTE — Assessment & Plan Note (Signed)
Resolved. potassium 4.5.Marland Kitchen  Stop potassium supplementation.
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
## 2022-09-25 NOTE — Progress Notes (Signed)
Radiation Oncology Follow up Note  Name: Curtis Cooper   Date:   09/25/2022 MRN:  [id number] DOB: 03/18/1950    This 72 y.o. 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  REFERRING PROVIDER: Marisue Ivan, MD  HPI: Patient is a 72 year old male now out 1 month having completed concurrent chemoradiation therapy for stage IVa p16 positive squamous cell carcinoma the right tonsil.  Seen today in routine follow-up he is doing well he specifically denies any head and neck pain or dysphagia does have some continued dry desquamation of the skin in his neck.  He has not yet seen ENT although that appointment has been arranged..  Patient's weight is stable he has gained 8 pounds since completing treatment  COMPLICATIONS OF TREATMENT: none  FOLLOW UP COMPLIANCE: keeps appointments   PHYSICAL EXAM:  There were no vitals taken for this visit. Oral cavity is clear no oral mucosal lesions are identified.  Neck is clear without evidence of cervical adenopathy.  He does have some dry desquamation of the skin.  Well-developed well-nourished patient in NAD. HEENT reveals PERLA, EOMI, discs not visualized.  Oral cavity is clear. No oral mucosal lesions are identified. Neck is clear without evidence of cervical or supraclavicular adenopathy. Lungs are clear to A&P. Cardiac examination is essentially unremarkable with regular rate and rhythm without murmur rub or thrill. Abdomen is benign with no organomegaly or masses noted. Motor sensory and DTR levels are equal and symmetric in the upper and lower extremities. Cranial nerves II through XII are grossly intact. Proprioception is intact. No peripheral adenopathy or edema is identified. No motor or sensory levels are noted. Crude visual fields are within normal range.  RADIOLOGY RESULTS: CT scan in 3 months ordered of  the soft tissue of the head and neck  PLAN: Present time patient is doing well 1 month out from concurrent chemoradiation therapy for stage IVa squamous cell carcinoma of the tonsil.  I am pleased with his overall progress.  He will schedule follow-up appoint with ENT.  I have asked to see him back in 3 months for follow-up with repeat CT scan of the soft tissue of the head and neck at that time.  Patient is to call with any concerns.  I would like to take this opportunity to thank you for allowing me to participate in the care of your patient.[name], MD

## 2022-09-26 ENCOUNTER — Other Ambulatory Visit: Payer: Self-pay

## 2022-10-09 ENCOUNTER — Other Ambulatory Visit: Payer: Self-pay | Admitting: Oncology

## 2022-10-10 ENCOUNTER — Other Ambulatory Visit: Payer: Self-pay | Admitting: Oncology

## 2022-10-10 MED ORDER — NYSTATIN 100000 UNIT/ML MT SUSP: 5.0000 mL | Freq: Four times a day (QID) | OROMUCOSAL | 3 refills | Status: DC

## 2022-10-15 ENCOUNTER — Encounter: Payer: Self-pay | Admitting: Oncology

## 2022-10-16 ENCOUNTER — Inpatient Hospital Stay (HOSPITAL_BASED_OUTPATIENT_CLINIC_OR_DEPARTMENT_OTHER): Payer: Medicare Other | Admitting: Hospice and Palliative Medicine

## 2022-10-16 ENCOUNTER — Other Ambulatory Visit: Payer: Self-pay

## 2022-10-16 ENCOUNTER — Inpatient Hospital Stay: Payer: Medicare Other | Attending: Oncology

## 2022-10-16 ENCOUNTER — Encounter: Payer: Self-pay | Admitting: Hospice and Palliative Medicine

## 2022-10-16 ENCOUNTER — Inpatient Hospital Stay: Payer: Medicare Other

## 2022-10-16 VITALS — BP 133/74 | HR 75 | Temp 97.0°F | Resp 20 | Ht 70.0 in | Wt 139.0 lb

## 2022-10-16 DIAGNOSIS — C099 Malignant neoplasm of tonsil, unspecified: Secondary | ICD-10-CM | POA: Diagnosis not present

## 2022-10-16 DIAGNOSIS — Z923 Personal history of irradiation: Secondary | ICD-10-CM | POA: Insufficient documentation

## 2022-10-16 DIAGNOSIS — B379 Candidiasis, unspecified: Secondary | ICD-10-CM | POA: Insufficient documentation

## 2022-10-16 DIAGNOSIS — Z8619 Personal history of other infectious and parasitic diseases: Secondary | ICD-10-CM | POA: Insufficient documentation

## 2022-10-16 DIAGNOSIS — Z87442 Personal history of urinary calculi: Secondary | ICD-10-CM | POA: Insufficient documentation

## 2022-10-16 DIAGNOSIS — I700 Atherosclerosis of aorta: Secondary | ICD-10-CM | POA: Insufficient documentation

## 2022-10-16 DIAGNOSIS — R112 Nausea with vomiting, unspecified: Secondary | ICD-10-CM | POA: Insufficient documentation

## 2022-10-16 DIAGNOSIS — Z79899 Other long term (current) drug therapy: Secondary | ICD-10-CM | POA: Diagnosis not present

## 2022-10-16 DIAGNOSIS — B370 Candidal stomatitis: Secondary | ICD-10-CM

## 2022-10-16 DIAGNOSIS — N62 Hypertrophy of breast: Secondary | ICD-10-CM | POA: Insufficient documentation

## 2022-10-16 LAB — CMP (CANCER CENTER ONLY)
ALT: 12 U/L (ref 0–44)
AST: 15 U/L (ref 15–41)
Albumin: 4 g/dL (ref 3.5–5.0)
Alkaline Phosphatase: 54 U/L (ref 38–126)
Anion gap: 5 (ref 5–15)
BUN: 28 mg/dL — ABNORMAL HIGH (ref 8–23)
CO2: 20 mmol/L — ABNORMAL LOW (ref 22–32)
Calcium: 9.6 mg/dL (ref 8.9–10.3)
Chloride: 110 mmol/L (ref 98–111)
Creatinine: 1.15 mg/dL (ref 0.61–1.24)
GFR, Estimated: >60 mL/min (ref >60)
Glucose, Bld: 111 mg/dL — ABNORMAL HIGH (ref 70–99)
Potassium: 4.2 mmol/L (ref 3.5–5.1)
Sodium: 135 mmol/L (ref 135–145)
Total Bilirubin: 0.5 mg/dL (ref 0.3–1.2)
Total Protein: 6.9 g/dL (ref 6.5–8.1)

## 2022-10-16 LAB — MAGNESIUM: Magnesium: 2 mg/dL (ref 1.7–2.4)

## 2022-10-16 LAB — CBC WITH DIFFERENTIAL (CANCER CENTER ONLY)
Abs Immature Granulocytes: 0.04 10*3/uL (ref 0.00–0.07)
Basophils Absolute: 0 10*3/uL (ref 0.0–0.1)
Basophils Relative: 0 %
Eosinophils Absolute: 0.1 10*3/uL (ref 0.0–0.5)
Eosinophils Relative: 2 %
HCT: 28.1 % — ABNORMAL LOW (ref 39.0–52.0)
Hemoglobin: 9.2 g/dL — ABNORMAL LOW (ref 13.0–17.0)
Immature Granulocytes: 1 %
Lymphocytes Relative: 34 %
Lymphs Abs: 1.3 10*3/uL (ref 0.7–4.0)
MCH: 31.2 pg (ref 26.0–34.0)
MCHC: 32.7 g/dL (ref 30.0–36.0)
MCV: 95.3 fL (ref 80.0–100.0)
Monocytes Absolute: 0.5 10*3/uL (ref 0.1–1.0)
Monocytes Relative: 13 %
Neutro Abs: 1.9 10*3/uL (ref 1.7–7.7)
Neutrophils Relative %: 50 %
Platelet Count: 232 10*3/uL (ref 150–400)
RBC: 2.95 MIL/uL — ABNORMAL LOW (ref 4.22–5.81)
RDW: 22.1 % — ABNORMAL HIGH (ref 11.5–15.5)
WBC Count: 3.9 10*3/uL — ABNORMAL LOW (ref 4.0–10.5)
nRBC: 0 % (ref 0.0–0.2)

## 2022-10-16 MED ORDER — FLUCONAZOLE 100 MG PO TABS: ORAL_TABLET | ORAL | 0 refills | Status: DC

## 2022-10-16 MED ORDER — HEPARIN SOD (PORK) LOCK FLUSH 100 UNIT/ML IV SOLN
500.0000 [IU] | Freq: Once | INTRAVENOUS | Status: AC
  Administered 2022-10-16: 500 [IU] via INTRAVENOUS
  Filled 2022-10-16: qty 5

## 2022-10-16 MED ORDER — SODIUM CHLORIDE 0.9% FLUSH
10.0000 mL | Freq: Once | INTRAVENOUS | Status: AC
  Administered 2022-10-16: 10 mL via INTRAVENOUS
  Filled 2022-10-16: qty 10
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
## 2022-10-16 NOTE — Patient Instructions (Addendum)
Baking soda mouthrinse. 2 cups (500 mL) of warm water mixed with 1/2 teaspoon (2.5 mL) baking soda and 1/4 teaspoon (1.25 mL) salt, followed by rinsing your mouth with plain water. Use an extra-soft toothbrush and a mild toothpaste. You can soften your toothbrush in hot water before using it. You may gargle and spit out mouth rinse.    Apt with ENT on 11/04/22 at 945 am with Dr. Irving Shows. [address] [place], [place] [address]

## 2022-10-22 ENCOUNTER — Inpatient Hospital Stay: Payer: Medicare Other

## 2022-10-22 NOTE — Progress Notes (Signed)
Nutrition Follow-up:  Patient with stage IV SCC of right tonsil, p 16+.  Patient completed radiation on 7/12 and last chemo was held.  Spoke with patient via phone for nutrition follow-up.  Reports that appetite is good.  He is getting up at 6am and cooking breakfast and thinking about the next meal.  Usually has eggs, sausage, biscuits and jelly for breakfast.  Ate a hamburger for lunch.  Had pizza last night for supper.  Does not have a taste for peanut butter or ice cream currently.  Still being treatment for thrush.  Says that he started walking on track about 1 month ago.  Started with 1/2 lap and now up to 2 laps.      Medications: reviewed  Labs: reviewed  Anthropometrics:   Weight on 9/4 139 lb   Thinks he weighs 151 lb now   NUTRITION DIAGNOSIS: Predicted sub optimal energy intake improved   INTERVENTION:  Continue high calorie, high protein foods to continue weight gain Encouraged patient to contact RD if needed    NEXT VISIT: no follow-up   Curtis Cooper B. Freida Busman, RD, LDN Registered Dietitian [phone]

## 2022-10-29 ENCOUNTER — Telehealth: Payer: Self-pay | Admitting: *Deleted

## 2022-10-29 ENCOUNTER — Encounter: Payer: Self-pay | Admitting: Hospice and Palliative Medicine

## 2022-10-29 DIAGNOSIS — B370 Candidal stomatitis: Secondary | ICD-10-CM

## 2022-10-29 DIAGNOSIS — C099 Malignant neoplasm of tonsil, unspecified: Secondary | ICD-10-CM

## 2022-10-29 MED ORDER — NYSTATIN 100000 UNIT/ML MT SUSP: 5.0000 mL | Freq: Four times a day (QID) | OROMUCOSAL | 3 refills | Status: DC

## 2022-10-29 NOTE — Telephone Encounter (Signed)
Per v/o Josh- RF sent for Nystatin mouthrinse.

## 2022-11-11 ENCOUNTER — Other Ambulatory Visit: Payer: Medicare Other

## 2022-11-13 ENCOUNTER — Ambulatory Visit
Admission: RE | Admit: 2022-11-13 | Discharge: 2022-11-13 | Disposition: A | Discharge status: Home / Self Care | Payer: Medicare Other | Source: Ambulatory Visit | Attending: Oncology | Admitting: Oncology

## 2022-11-13 DIAGNOSIS — I700 Atherosclerosis of aorta: Secondary | ICD-10-CM | POA: Diagnosis not present

## 2022-11-13 DIAGNOSIS — C099 Malignant neoplasm of tonsil, unspecified: Secondary | ICD-10-CM | POA: Diagnosis present

## 2022-11-13 LAB — GLUCOSE, CAPILLARY: Glucose-Capillary: 108 mg/dL — ABNORMAL HIGH (ref 70–99)

## 2022-11-13 MED ORDER — FLUDEOXYGLUCOSE F - 18 (FDG) INJECTION
8.7100 | Freq: Once | INTRAVENOUS | Status: AC
  Administered 2022-11-13: 8.71 via INTRAVENOUS

## 2022-11-18 ENCOUNTER — Inpatient Hospital Stay: Payer: Medicare Other | Attending: Oncology

## 2022-11-18 ENCOUNTER — Inpatient Hospital Stay (HOSPITAL_BASED_OUTPATIENT_CLINIC_OR_DEPARTMENT_OTHER): Payer: Medicare Other | Admitting: Oncology

## 2022-11-18 ENCOUNTER — Ambulatory Visit: Payer: Medicare Other | Admitting: Oncology

## 2022-11-18 ENCOUNTER — Encounter: Payer: Self-pay | Admitting: Oncology

## 2022-11-18 ENCOUNTER — Other Ambulatory Visit: Payer: Medicare Other

## 2022-11-18 VITALS — BP 139/63 | HR 63 | Temp 97.5°F | Resp 18 | Wt 161.8 lb

## 2022-11-18 DIAGNOSIS — R202 Paresthesia of skin: Secondary | ICD-10-CM | POA: Insufficient documentation

## 2022-11-18 DIAGNOSIS — G620 Drug-induced polyneuropathy: Secondary | ICD-10-CM | POA: Diagnosis not present

## 2022-11-18 DIAGNOSIS — Z9049 Acquired absence of other specified parts of digestive tract: Secondary | ICD-10-CM | POA: Diagnosis not present

## 2022-11-18 DIAGNOSIS — D649 Anemia, unspecified: Secondary | ICD-10-CM | POA: Diagnosis not present

## 2022-11-18 DIAGNOSIS — R110 Nausea: Secondary | ICD-10-CM | POA: Diagnosis not present

## 2022-11-18 DIAGNOSIS — Z87442 Personal history of urinary calculi: Secondary | ICD-10-CM | POA: Insufficient documentation

## 2022-11-18 DIAGNOSIS — N644 Mastodynia: Secondary | ICD-10-CM | POA: Insufficient documentation

## 2022-11-18 DIAGNOSIS — R197 Diarrhea, unspecified: Secondary | ICD-10-CM | POA: Diagnosis not present

## 2022-11-18 DIAGNOSIS — Z806 Family history of leukemia: Secondary | ICD-10-CM | POA: Diagnosis not present

## 2022-11-18 DIAGNOSIS — Z87891 Personal history of nicotine dependence: Secondary | ICD-10-CM | POA: Insufficient documentation

## 2022-11-18 DIAGNOSIS — N62 Hypertrophy of breast: Secondary | ICD-10-CM | POA: Diagnosis not present

## 2022-11-18 DIAGNOSIS — Z1329 Encounter for screening for other suspected endocrine disorder: Secondary | ICD-10-CM

## 2022-11-18 DIAGNOSIS — Z79899 Other long term (current) drug therapy: Secondary | ICD-10-CM | POA: Insufficient documentation

## 2022-11-18 DIAGNOSIS — R635 Abnormal weight gain: Secondary | ICD-10-CM | POA: Diagnosis not present

## 2022-11-18 DIAGNOSIS — E781 Pure hyperglyceridemia: Secondary | ICD-10-CM | POA: Diagnosis not present

## 2022-11-18 DIAGNOSIS — B370 Candidal stomatitis: Secondary | ICD-10-CM

## 2022-11-18 DIAGNOSIS — C099 Malignant neoplasm of tonsil, unspecified: Secondary | ICD-10-CM | POA: Insufficient documentation

## 2022-11-18 DIAGNOSIS — Z95828 Presence of other vascular implants and grafts: Secondary | ICD-10-CM

## 2022-11-18 DIAGNOSIS — Z8619 Personal history of other infectious and parasitic diseases: Secondary | ICD-10-CM | POA: Insufficient documentation

## 2022-11-18 DIAGNOSIS — T451X5A Adverse effect of antineoplastic and immunosuppressive drugs, initial encounter: Secondary | ICD-10-CM

## 2022-11-18 DIAGNOSIS — R200 Anesthesia of skin: Secondary | ICD-10-CM | POA: Insufficient documentation

## 2022-11-18 DIAGNOSIS — I700 Atherosclerosis of aorta: Secondary | ICD-10-CM | POA: Insufficient documentation

## 2022-11-18 LAB — CMP (CANCER CENTER ONLY)
ALT: 27 U/L (ref 0–44)
AST: 26 U/L (ref 15–41)
Albumin: 3.8 g/dL (ref 3.5–5.0)
Alkaline Phosphatase: 55 U/L (ref 38–126)
Anion gap: 6 (ref 5–15)
BUN: 22 mg/dL (ref 8–23)
CO2: 23 mmol/L (ref 22–32)
Calcium: 9.1 mg/dL (ref 8.9–10.3)
Chloride: 107 mmol/L (ref 98–111)
Creatinine: 1.23 mg/dL (ref 0.61–1.24)
GFR, Estimated: >60 mL/min (ref >60)
Glucose, Bld: 190 mg/dL — ABNORMAL HIGH (ref 70–99)
Potassium: 3.9 mmol/L (ref 3.5–5.1)
Sodium: 136 mmol/L (ref 135–145)
Total Bilirubin: 0.5 mg/dL (ref 0.3–1.2)
Total Protein: 6.5 g/dL (ref 6.5–8.1)

## 2022-11-18 LAB — CBC (CANCER CENTER ONLY)
HCT: 32.6 % — ABNORMAL LOW (ref 39.0–52.0)
Hemoglobin: 11.1 g/dL — ABNORMAL LOW (ref 13.0–17.0)
MCH: 33 pg (ref 26.0–34.0)
MCHC: 34 g/dL (ref 30.0–36.0)
MCV: 97 fL (ref 80.0–100.0)
Platelet Count: 193 10*3/uL (ref 150–400)
RBC: 3.36 MIL/uL — ABNORMAL LOW (ref 4.22–5.81)
RDW: 11.9 % (ref 11.5–15.5)
WBC Count: 4.4 10*3/uL (ref 4.0–10.5)
nRBC: 0 % (ref 0.0–0.2)

## 2022-11-18 LAB — TSH: TSH: 1.727 u[IU]/mL (ref 0.350–4.500)

## 2022-11-18 MED ORDER — FLUCONAZOLE 200 MG PO TABS: 200.0000 mg | ORAL_TABLET | ORAL | 0 refills | Status: DC

## 2022-11-18 MED ORDER — HEPARIN SOD (PORK) LOCK FLUSH 100 UNIT/ML IV SOLN
500.0000 [IU] | Freq: Once | INTRAVENOUS | Status: AC
  Administered 2022-11-18: 500 [IU] via INTRAVENOUS
  Filled 2022-11-18: qty 5

## 2022-11-18 MED ORDER — SODIUM CHLORIDE 0.9% FLUSH
10.0000 mL | Freq: Once | INTRAVENOUS | Status: AC
  Administered 2022-11-18: 10 mL via INTRAVENOUS
  Filled 2022-11-18: qty 10
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
## 2022-11-18 NOTE — Assessment & Plan Note (Signed)
Hb has improved. monitor

## 2022-11-18 NOTE — Assessment & Plan Note (Signed)
Grade 1, improved.  Observation.

## 2022-11-18 NOTE — Assessment & Plan Note (Addendum)
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  No need for IV fluids session. Follow up with ENT 8 weeks after radiation.  Repeat PET scan in 10-12 weeks for evaluation treatment response.

## 2022-11-18 NOTE — Assessment & Plan Note (Signed)
Persistent oral thrush, slightly better after fluconazole 200mg  followed by 100mg  x 14 days.  Recommend to increase fluconazole to 400mg  x 1 followed by 200mg  daily x 14 days, Rx sent.

## 2022-11-19 ENCOUNTER — Other Ambulatory Visit: Payer: Self-pay

## 2022-11-20 ENCOUNTER — Other Ambulatory Visit: Payer: Self-pay

## 2022-12-19 ENCOUNTER — Other Ambulatory Visit: Payer: Self-pay | Admitting: Oncology

## 2022-12-19 DIAGNOSIS — C099 Malignant neoplasm of tonsil, unspecified: Secondary | ICD-10-CM

## 2022-12-19 DIAGNOSIS — B370 Candidal stomatitis: Secondary | ICD-10-CM

## 2022-12-26 ENCOUNTER — Other Ambulatory Visit: Payer: Medicare Other

## 2023-01-02 ENCOUNTER — Ambulatory Visit: Payer: Medicare Other | Admitting: Radiation Oncology

## 2023-01-13 ENCOUNTER — Inpatient Hospital Stay: Payer: Medicare Other | Attending: Oncology

## 2023-01-13 DIAGNOSIS — E876 Hypokalemia: Secondary | ICD-10-CM | POA: Insufficient documentation

## 2023-01-13 DIAGNOSIS — Z79899 Other long term (current) drug therapy: Secondary | ICD-10-CM | POA: Insufficient documentation

## 2023-01-13 DIAGNOSIS — E785 Hyperlipidemia, unspecified: Secondary | ICD-10-CM | POA: Diagnosis not present

## 2023-01-13 DIAGNOSIS — C099 Malignant neoplasm of tonsil, unspecified: Secondary | ICD-10-CM | POA: Diagnosis present

## 2023-01-13 DIAGNOSIS — Z95828 Presence of other vascular implants and grafts: Secondary | ICD-10-CM

## 2023-01-13 MED ORDER — SODIUM CHLORIDE 0.9% FLUSH
10.0000 mL | Freq: Once | INTRAVENOUS | Status: AC
  Administered 2023-01-13: 10 mL via INTRAVENOUS
  Filled 2023-01-13: qty 10

## 2023-01-13 MED ORDER — HEPARIN SOD (PORK) LOCK FLUSH 100 UNIT/ML IV SOLN
500.0000 [IU] | Freq: Once | INTRAVENOUS | Status: AC
  Administered 2023-01-13: 500 [IU] via INTRAVENOUS
  Filled 2023-01-13: qty 5

## 2023-03-17 ENCOUNTER — Other Ambulatory Visit: Payer: Self-pay | Admitting: *Deleted

## 2023-03-17 DIAGNOSIS — R9389 Abnormal findings on diagnostic imaging of other specified body structures: Secondary | ICD-10-CM

## 2023-03-17 DIAGNOSIS — C099 Malignant neoplasm of tonsil, unspecified: Secondary | ICD-10-CM

## 2023-03-18 ENCOUNTER — Inpatient Hospital Stay (HOSPITAL_BASED_OUTPATIENT_CLINIC_OR_DEPARTMENT_OTHER): Payer: Medicare Other | Admitting: Oncology

## 2023-03-18 ENCOUNTER — Encounter: Payer: Self-pay | Admitting: Oncology

## 2023-03-18 ENCOUNTER — Inpatient Hospital Stay: Payer: Medicare Other | Attending: Oncology

## 2023-03-18 VITALS — BP 151/78 | HR 64 | Temp 96.2°F | Resp 18 | Wt 167.8 lb

## 2023-03-18 DIAGNOSIS — N644 Mastodynia: Secondary | ICD-10-CM | POA: Insufficient documentation

## 2023-03-18 DIAGNOSIS — G620 Drug-induced polyneuropathy: Secondary | ICD-10-CM | POA: Diagnosis not present

## 2023-03-18 DIAGNOSIS — Z8619 Personal history of other infectious and parasitic diseases: Secondary | ICD-10-CM | POA: Insufficient documentation

## 2023-03-18 DIAGNOSIS — C099 Malignant neoplasm of tonsil, unspecified: Secondary | ICD-10-CM | POA: Diagnosis present

## 2023-03-18 DIAGNOSIS — Z1329 Encounter for screening for other suspected endocrine disorder: Secondary | ICD-10-CM

## 2023-03-18 DIAGNOSIS — Z79899 Other long term (current) drug therapy: Secondary | ICD-10-CM | POA: Diagnosis not present

## 2023-03-18 DIAGNOSIS — Z87891 Personal history of nicotine dependence: Secondary | ICD-10-CM | POA: Insufficient documentation

## 2023-03-18 DIAGNOSIS — N62 Hypertrophy of breast: Secondary | ICD-10-CM | POA: Diagnosis not present

## 2023-03-18 DIAGNOSIS — B370 Candidal stomatitis: Secondary | ICD-10-CM | POA: Insufficient documentation

## 2023-03-18 DIAGNOSIS — T451X5A Adverse effect of antineoplastic and immunosuppressive drugs, initial encounter: Secondary | ICD-10-CM | POA: Diagnosis not present

## 2023-03-18 DIAGNOSIS — R110 Nausea: Secondary | ICD-10-CM | POA: Insufficient documentation

## 2023-03-18 DIAGNOSIS — I700 Atherosclerosis of aorta: Secondary | ICD-10-CM | POA: Insufficient documentation

## 2023-03-18 DIAGNOSIS — Z806 Family history of leukemia: Secondary | ICD-10-CM | POA: Insufficient documentation

## 2023-03-18 DIAGNOSIS — R9389 Abnormal findings on diagnostic imaging of other specified body structures: Secondary | ICD-10-CM

## 2023-03-18 DIAGNOSIS — Z87442 Personal history of urinary calculi: Secondary | ICD-10-CM | POA: Diagnosis not present

## 2023-03-18 DIAGNOSIS — R197 Diarrhea, unspecified: Secondary | ICD-10-CM | POA: Insufficient documentation

## 2023-03-18 DIAGNOSIS — E781 Pure hyperglyceridemia: Secondary | ICD-10-CM | POA: Insufficient documentation

## 2023-03-18 DIAGNOSIS — Z95828 Presence of other vascular implants and grafts: Secondary | ICD-10-CM

## 2023-03-18 LAB — CMP (CANCER CENTER ONLY)
ALT: 30 U/L (ref 0–44)
AST: 27 U/L (ref 15–41)
Albumin: 4.3 g/dL (ref 3.5–5.0)
Alkaline Phosphatase: 55 U/L (ref 38–126)
Anion gap: 9 (ref 5–15)
BUN: 26 mg/dL — ABNORMAL HIGH (ref 8–23)
CO2: 22 mmol/L (ref 22–32)
Calcium: 9.6 mg/dL (ref 8.9–10.3)
Chloride: 106 mmol/L (ref 98–111)
Creatinine: 1.22 mg/dL (ref 0.61–1.24)
GFR, Estimated: >60 mL/min (ref >60)
Glucose, Bld: 159 mg/dL — ABNORMAL HIGH (ref 70–99)
Potassium: 4.2 mmol/L (ref 3.5–5.1)
Sodium: 137 mmol/L (ref 135–145)
Total Bilirubin: 0.6 mg/dL (ref 0.0–1.2)
Total Protein: 7.2 g/dL (ref 6.5–8.1)

## 2023-03-18 LAB — CBC WITH DIFFERENTIAL/PLATELET
Abs Immature Granulocytes: 0.02 10*3/uL (ref 0.00–0.07)
Basophils Absolute: 0 10*3/uL (ref 0.0–0.1)
Basophils Relative: 0 %
Eosinophils Absolute: 0.1 10*3/uL (ref 0.0–0.5)
Eosinophils Relative: 2 %
HCT: 38.5 % — ABNORMAL LOW (ref 39.0–52.0)
Hemoglobin: 13.3 g/dL (ref 13.0–17.0)
Immature Granulocytes: 0 %
Lymphocytes Relative: 33 %
Lymphs Abs: 1.7 10*3/uL (ref 0.7–4.0)
MCH: 30.5 pg (ref 26.0–34.0)
MCHC: 34.5 g/dL (ref 30.0–36.0)
MCV: 88.3 fL (ref 80.0–100.0)
Monocytes Absolute: 0.5 10*3/uL (ref 0.1–1.0)
Monocytes Relative: 9 %
Neutro Abs: 2.8 10*3/uL (ref 1.7–7.7)
Neutrophils Relative %: 56 %
Platelets: 192 10*3/uL (ref 150–400)
RBC: 4.36 MIL/uL (ref 4.22–5.81)
RDW: 13.2 % (ref 11.5–15.5)
WBC: 5.1 10*3/uL (ref 4.0–10.5)
nRBC: 0 % (ref 0.0–0.2)

## 2023-03-18 LAB — TSH: TSH: 1.816 u[IU]/mL (ref 0.350–4.500)

## 2023-03-18 MED ORDER — HEPARIN SOD (PORK) LOCK FLUSH 100 UNIT/ML IV SOLN
500.0000 [IU] | Freq: Once | INTRAVENOUS | Status: AC
  Administered 2023-03-18: 500 [IU] via INTRAVENOUS
  Filled 2023-03-18: qty 5

## 2023-03-18 MED ORDER — SODIUM CHLORIDE 0.9% FLUSH
10.0000 mL | Freq: Once | INTRAVENOUS | Status: AC
  Administered 2023-03-18: 10 mL via INTRAVENOUS
  Filled 2023-03-18: qty 10

## 2023-03-18 MED ORDER — FLUCONAZOLE 200 MG PO TABS: 200.0000 mg | ORAL_TABLET | ORAL | 0 refills | Status: DC

## 2023-03-18 NOTE — Assessment & Plan Note (Signed)
Grade 1, improved.  Observation.
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
## 2023-03-18 NOTE — Assessment & Plan Note (Signed)
Recommend a course of fluconazole rx sent

## 2023-03-18 NOTE — Assessment & Plan Note (Addendum)
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  CT neck soft tissue for surveillance. Continue follow up with ENT

## 2023-03-19 ENCOUNTER — Other Ambulatory Visit: Payer: Self-pay

## 2023-04-02 ENCOUNTER — Ambulatory Visit
Admission: RE | Admit: 2023-04-02 | Discharge: 2023-04-02 | Disposition: A | Discharge status: Home / Self Care | Payer: Medicare Other | Source: Ambulatory Visit | Attending: Radiation Oncology | Admitting: Radiation Oncology

## 2023-04-02 DIAGNOSIS — C099 Malignant neoplasm of tonsil, unspecified: Secondary | ICD-10-CM | POA: Diagnosis present

## 2023-04-02 MED ORDER — SODIUM CHLORIDE 0.9 % IV BOLUS
50.0000 mL | Freq: Once | INTRAVENOUS | Status: AC
  Administered 2023-04-02: 50 mL via INTRAVENOUS

## 2023-04-02 MED ORDER — IOHEXOL 300 MG/ML  SOLN
75.0000 mL | Freq: Once | INTRAMUSCULAR | Status: AC | PRN
  Administered 2023-04-02: 75 mL via INTRAVENOUS

## 2023-04-09 ENCOUNTER — Ambulatory Visit
Admission: RE | Admit: 2023-04-09 | Discharge: 2023-04-09 | Disposition: A | Discharge status: Home / Self Care | Payer: Medicare Other | Source: Ambulatory Visit | Attending: Radiation Oncology | Admitting: Radiation Oncology

## 2023-04-09 ENCOUNTER — Encounter: Payer: Self-pay | Admitting: Radiation Oncology

## 2023-04-09 VITALS — BP 161/81 | HR 69 | Temp 97.0°F | Resp 16 | Wt 173.0 lb

## 2023-04-09 DIAGNOSIS — Z923 Personal history of irradiation: Secondary | ICD-10-CM | POA: Diagnosis not present

## 2023-04-09 DIAGNOSIS — C099 Malignant neoplasm of tonsil, unspecified: Secondary | ICD-10-CM

## 2023-04-09 DIAGNOSIS — B370 Candidal stomatitis: Secondary | ICD-10-CM | POA: Insufficient documentation

## 2023-04-09 DIAGNOSIS — Z9221 Personal history of antineoplastic chemotherapy: Secondary | ICD-10-CM | POA: Diagnosis not present

## 2023-04-09 NOTE — Progress Notes (Signed)
 Survivorship Care Plan visit completed.  Treatment summary reviewed and given to patient.  ASCO answers booklet reviewed and given to patient.  CARE program and Cancer Transitions discussed with patient along with other resources cancer center offers to patients and caregivers.  Patient verbalized understanding.

## 2023-04-09 NOTE — Progress Notes (Signed)
 Radiation Oncology Follow up Note  Name: Curtis Cooper   Date:   04/09/2023 MRN:  [id number] DOB: 11/12/1950    This 73 y.o. male presents to the clinic today for 42-month follow-up status post concurrent chemoradiation therapy for stage IVa (T1 cN2a M0 squamous cell carcinoma the right palate teen tonsil p16 positive.Marland Kitchen   REFERRING PROVIDER: Marisue Ivan, MD  HPI: Patient is a 73 year old male now out for months having completed concurrent chemoradiation therapy for stage IVa squamous cell carcinoma the right tonsil p16 positive.  Seen today in routine follow-up he is doing well he is having no dysphagia head and neck pain.  Has been treated with multiple bouts of medication for persistent oral thrush.  It seems to be mostly been treated with Diflucan.  Also has been treated with nystatin.  Patient had a recent CT scan showing no evidence of recurrent or metastatic disease with some mild posttreatment scarring on the right.  COMPLICATIONS OF TREATMENT: none  FOLLOW UP COMPLIANCE: keeps appointments   PHYSICAL EXAM:  BP (!) 161/81 Comment: white coat syndrome  Pulse 69   Temp (!) 97 F (36.1 C) (Temporal)   Resp 16   Wt 173 lb (78.5 kg)   BMI 24.82 kg/m  Oral cavity shows no evidence of candidiasis.  There is a white plaque over his tongue which is most likely related to his previous treatments.  No oral mucositis is noted neck is clear without evidence of cervical or supraclavicular adenopathy.  Well-developed well-nourished patient in NAD. HEENT reveals PERLA, EOMI, discs not visualized.  Oral cavity is clear. No oral mucosal lesions are identified. Neck is clear without evidence of cervical or supraclavicular adenopathy. Lungs are clear to A&P. Cardiac examination is essentially unremarkable with regular rate and rhythm without murmur rub or thrill. Abdomen is benign with no organomegaly or masses noted. Motor sensory and DTR levels are equal and symmetric in the upper and lower  extremities. Cranial nerves II through XII are grossly intact. Proprioception is intact. No peripheral adenopathy or edema is identified. No motor or sensory levels are noted. Crude visual fields are within normal range.  RADIOLOGY RESULTS: CT scan reviewed compatible with above-stated findings  PLAN: Present time do not think he has any persistent candidiasis.  Of asked him to brush his tongue every day with hydroperoxide type toothpaste.  Otherwise he is doing well with no evidence of disease at this point time.  Of asked to see him back in 6 months for follow-up.  Patient knows to call sooner if any problems persist.  I would like to take this opportunity to thank you for allowing me to participate in the care of your patient.Curtis Miller, MD

## 2023-04-10 ENCOUNTER — Other Ambulatory Visit: Payer: Self-pay

## 2023-05-13 ENCOUNTER — Inpatient Hospital Stay: Payer: Medicare Other | Attending: Oncology

## 2023-05-13 DIAGNOSIS — G620 Drug-induced polyneuropathy: Secondary | ICD-10-CM | POA: Insufficient documentation

## 2023-05-13 DIAGNOSIS — C099 Malignant neoplasm of tonsil, unspecified: Secondary | ICD-10-CM | POA: Insufficient documentation

## 2023-05-13 DIAGNOSIS — T451X5A Adverse effect of antineoplastic and immunosuppressive drugs, initial encounter: Secondary | ICD-10-CM | POA: Insufficient documentation

## 2023-05-13 DIAGNOSIS — Z79899 Other long term (current) drug therapy: Secondary | ICD-10-CM | POA: Insufficient documentation

## 2023-05-15 ENCOUNTER — Inpatient Hospital Stay

## 2023-05-15 ENCOUNTER — Other Ambulatory Visit: Payer: Self-pay

## 2023-05-15 DIAGNOSIS — T451X5A Adverse effect of antineoplastic and immunosuppressive drugs, initial encounter: Secondary | ICD-10-CM | POA: Diagnosis not present

## 2023-05-15 DIAGNOSIS — Z1329 Encounter for screening for other suspected endocrine disorder: Secondary | ICD-10-CM

## 2023-05-15 DIAGNOSIS — C099 Malignant neoplasm of tonsil, unspecified: Secondary | ICD-10-CM | POA: Diagnosis present

## 2023-05-15 DIAGNOSIS — Z95828 Presence of other vascular implants and grafts: Secondary | ICD-10-CM

## 2023-05-15 DIAGNOSIS — G620 Drug-induced polyneuropathy: Secondary | ICD-10-CM | POA: Diagnosis not present

## 2023-05-15 DIAGNOSIS — Z79899 Other long term (current) drug therapy: Secondary | ICD-10-CM | POA: Diagnosis not present

## 2023-05-15 MED ORDER — HEPARIN SOD (PORK) LOCK FLUSH 100 UNIT/ML IV SOLN
500.0000 [IU] | Freq: Once | INTRAVENOUS | Status: AC
  Administered 2023-05-15: 500 [IU] via INTRAVENOUS
  Filled 2023-05-15: qty 5

## 2023-05-15 MED ORDER — SODIUM CHLORIDE 0.9% FLUSH
10.0000 mL | Freq: Once | INTRAVENOUS | Status: AC
  Administered 2023-05-15: 10 mL via INTRAVENOUS
  Filled 2023-05-15: qty 10

## 2023-07-08 ENCOUNTER — Inpatient Hospital Stay: Payer: Medicare Other | Attending: Oncology

## 2023-07-08 DIAGNOSIS — C099 Malignant neoplasm of tonsil, unspecified: Secondary | ICD-10-CM | POA: Insufficient documentation

## 2023-07-08 DIAGNOSIS — Z95828 Presence of other vascular implants and grafts: Secondary | ICD-10-CM

## 2023-07-08 DIAGNOSIS — Z452 Encounter for adjustment and management of vascular access device: Secondary | ICD-10-CM | POA: Insufficient documentation

## 2023-07-08 MED ORDER — HEPARIN SOD (PORK) LOCK FLUSH 100 UNIT/ML IV SOLN
500.0000 [IU] | Freq: Once | INTRAVENOUS | Status: AC
  Administered 2023-07-08: 500 [IU] via INTRAVENOUS
  Filled 2023-07-08: qty 5

## 2023-07-08 MED ORDER — SODIUM CHLORIDE 0.9% FLUSH
10.0000 mL | INTRAVENOUS | Status: DC | PRN
  Administered 2023-07-08: 10 mL via INTRAVENOUS
  Filled 2023-07-08: qty 10

## 2023-09-02 ENCOUNTER — Inpatient Hospital Stay: Payer: Medicare Other | Attending: Oncology

## 2023-09-02 DIAGNOSIS — C099 Malignant neoplasm of tonsil, unspecified: Secondary | ICD-10-CM | POA: Insufficient documentation

## 2023-09-02 DIAGNOSIS — Z452 Encounter for adjustment and management of vascular access device: Secondary | ICD-10-CM | POA: Diagnosis present

## 2023-09-02 DIAGNOSIS — Z95828 Presence of other vascular implants and grafts: Secondary | ICD-10-CM

## 2023-09-02 MED ORDER — SODIUM CHLORIDE 0.9% FLUSH
10.0000 mL | Freq: Once | INTRAVENOUS | Status: AC
  Administered 2023-09-02: 10 mL via INTRAVENOUS
  Filled 2023-09-02: qty 10

## 2023-09-02 MED ORDER — HEPARIN SOD (PORK) LOCK FLUSH 100 UNIT/ML IV SOLN
500.0000 [IU] | Freq: Once | INTRAVENOUS | Status: AC
  Administered 2023-09-02: 500 [IU] via INTRAVENOUS
  Filled 2023-09-02: qty 5

## 2023-09-16 ENCOUNTER — Telehealth: Payer: Self-pay | Admitting: Oncology

## 2023-09-16 ENCOUNTER — Encounter: Payer: Self-pay | Admitting: Oncology

## 2023-09-16 ENCOUNTER — Inpatient Hospital Stay: Payer: Medicare Other | Attending: Oncology

## 2023-09-16 ENCOUNTER — Inpatient Hospital Stay (HOSPITAL_BASED_OUTPATIENT_CLINIC_OR_DEPARTMENT_OTHER): Payer: Medicare Other | Admitting: Oncology

## 2023-09-16 VITALS — BP 146/70 | HR 68 | Temp 96.1°F | Resp 18 | Wt 175.4 lb

## 2023-09-16 DIAGNOSIS — E781 Pure hyperglyceridemia: Secondary | ICD-10-CM | POA: Diagnosis not present

## 2023-09-16 DIAGNOSIS — T451X5A Adverse effect of antineoplastic and immunosuppressive drugs, initial encounter: Secondary | ICD-10-CM | POA: Diagnosis not present

## 2023-09-16 DIAGNOSIS — Z87442 Personal history of urinary calculi: Secondary | ICD-10-CM | POA: Diagnosis not present

## 2023-09-16 DIAGNOSIS — Z79899 Other long term (current) drug therapy: Secondary | ICD-10-CM | POA: Insufficient documentation

## 2023-09-16 DIAGNOSIS — Z8619 Personal history of other infectious and parasitic diseases: Secondary | ICD-10-CM | POA: Diagnosis not present

## 2023-09-16 DIAGNOSIS — R590 Localized enlarged lymph nodes: Secondary | ICD-10-CM | POA: Diagnosis not present

## 2023-09-16 DIAGNOSIS — Z95828 Presence of other vascular implants and grafts: Secondary | ICD-10-CM | POA: Insufficient documentation

## 2023-09-16 DIAGNOSIS — G620 Drug-induced polyneuropathy: Secondary | ICD-10-CM | POA: Diagnosis not present

## 2023-09-16 DIAGNOSIS — Z87891 Personal history of nicotine dependence: Secondary | ICD-10-CM | POA: Insufficient documentation

## 2023-09-16 DIAGNOSIS — N644 Mastodynia: Secondary | ICD-10-CM | POA: Insufficient documentation

## 2023-09-16 DIAGNOSIS — C099 Malignant neoplasm of tonsil, unspecified: Secondary | ICD-10-CM | POA: Diagnosis present

## 2023-09-16 DIAGNOSIS — N62 Hypertrophy of breast: Secondary | ICD-10-CM | POA: Insufficient documentation

## 2023-09-16 DIAGNOSIS — Z1329 Encounter for screening for other suspected endocrine disorder: Secondary | ICD-10-CM | POA: Diagnosis not present

## 2023-09-16 DIAGNOSIS — I700 Atherosclerosis of aorta: Secondary | ICD-10-CM | POA: Insufficient documentation

## 2023-09-16 LAB — CMP (CANCER CENTER ONLY)
ALT: 23 U/L (ref 0–44)
AST: 25 U/L (ref 15–41)
Albumin: 3.9 g/dL (ref 3.5–5.0)
Alkaline Phosphatase: 67 U/L (ref 38–126)
Anion gap: 7 (ref 5–15)
BUN: 21 mg/dL (ref 8–23)
CO2: 22 mmol/L (ref 22–32)
Calcium: 9.2 mg/dL (ref 8.9–10.3)
Chloride: 109 mmol/L (ref 98–111)
Creatinine: 1.17 mg/dL (ref 0.61–1.24)
GFR, Estimated: >60 mL/min (ref >60)
Glucose, Bld: 208 mg/dL — ABNORMAL HIGH (ref 70–99)
Potassium: 3.8 mmol/L (ref 3.5–5.1)
Sodium: 138 mmol/L (ref 135–145)
Total Bilirubin: 0.5 mg/dL (ref 0.0–1.2)
Total Protein: 6.5 g/dL (ref 6.5–8.1)

## 2023-09-16 LAB — CBC WITH DIFFERENTIAL (CANCER CENTER ONLY)
Abs Immature Granulocytes: 0.01 K/uL (ref 0.00–0.07)
Basophils Absolute: 0 K/uL (ref 0.0–0.1)
Basophils Relative: 0 %
Eosinophils Absolute: 0.2 K/uL (ref 0.0–0.5)
Eosinophils Relative: 4 %
HCT: 37.4 % — ABNORMAL LOW (ref 39.0–52.0)
Hemoglobin: 13.1 g/dL (ref 13.0–17.0)
Immature Granulocytes: 0 %
Lymphocytes Relative: 33 %
Lymphs Abs: 1.6 K/uL (ref 0.7–4.0)
MCH: 31.2 pg (ref 26.0–34.0)
MCHC: 35 g/dL (ref 30.0–36.0)
MCV: 89 fL (ref 80.0–100.0)
Monocytes Absolute: 0.4 K/uL (ref 0.1–1.0)
Monocytes Relative: 7 %
Neutro Abs: 2.7 K/uL (ref 1.7–7.7)
Neutrophils Relative %: 56 %
Platelet Count: 181 K/uL (ref 150–400)
RBC: 4.2 MIL/uL — ABNORMAL LOW (ref 4.22–5.81)
RDW: 13.1 % (ref 11.5–15.5)
WBC Count: 4.8 K/uL (ref 4.0–10.5)
nRBC: 0 % (ref 0.0–0.2)

## 2023-09-16 LAB — TSH: TSH: 0.854 u[IU]/mL (ref 0.350–4.500)

## 2023-09-16 NOTE — Assessment & Plan Note (Addendum)
 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  Obtain CT chest abdomen pelvis, neck with contrast for surveillance. Recommend patient to follow-up with ENT

## 2023-09-16 NOTE — Telephone Encounter (Signed)
 Called pt home number. A lady answered, but hung-up when I asked to speak to pt.

## 2023-09-16 NOTE — Assessment & Plan Note (Signed)
 Patient desires Mediport removal.  Recommend patient to wait until CT scan results as well as ENT evaluation

## 2023-09-16 NOTE — Telephone Encounter (Signed)
 Called pt to schedule CT's - vm full - LH

## 2023-09-16 NOTE — Assessment & Plan Note (Signed)
Grade 1, improved.  Observation.
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
## 2023-09-16 NOTE — Telephone Encounter (Signed)
 Called pt to schedule CT - vm was full - [facility]

## 2023-09-17 ENCOUNTER — Other Ambulatory Visit: Payer: Self-pay

## 2023-09-17 ENCOUNTER — Telehealth: Payer: Self-pay

## 2023-09-17 NOTE — Telephone Encounter (Signed)
-----   Message from Zelphia Cap sent at 09/16/2023  8:17 PM EDT ----- Please find out if he has been seen by ENT physician.  If he has, please refer him back. If he does not have an ENT, please refer him to Dr. Omer of head and neck.  For surveillance. [Patient does not recall seeing an ENT] Once his CT scan comes back negative and ENT evaluation negative, please refer him back to Dr. Marea for Mediport removal Thank you

## 2023-09-17 NOTE — Telephone Encounter (Signed)
 Per chart review, it looks like pt has been seen by Dr. Juengel (ENT). Faxed referral to ENT for pt to re-establish care for [facility] head/neck cancer surveillance.   It looks like scheduling has not been able to get in touch with pt to inform pt of CT scan. Letter mailed with CT appt details and to let him know that referral was sent to ENT.

## 2023-09-25 ENCOUNTER — Ambulatory Visit
Admission: RE | Admit: 2023-09-25 | Discharge: 2023-09-25 | Disposition: A | Discharge status: Home / Self Care | Source: Ambulatory Visit | Attending: Oncology | Admitting: Oncology

## 2023-09-25 ENCOUNTER — Ambulatory Visit

## 2023-09-25 ENCOUNTER — Encounter: Payer: Self-pay | Admitting: Oncology

## 2023-09-25 DIAGNOSIS — C099 Malignant neoplasm of tonsil, unspecified: Secondary | ICD-10-CM | POA: Insufficient documentation

## 2023-09-25 DIAGNOSIS — R590 Localized enlarged lymph nodes: Secondary | ICD-10-CM | POA: Diagnosis present

## 2023-09-25 MED ORDER — IOHEXOL 300 MG/ML  SOLN
100.0000 mL | Freq: Once | INTRAMUSCULAR | Status: AC | PRN
  Administered 2023-09-25: 100 mL via INTRAVENOUS

## 2023-09-25 MED ORDER — HEPARIN SOD (PORK) LOCK FLUSH 100 UNIT/ML IV SOLN
INTRAVENOUS | Status: AC
Start: 2023-09-25 — End: 2023-09-25
  Filled 2023-09-25: qty 5

## 2023-09-25 NOTE — Telephone Encounter (Signed)
 Pt scheduled to see Dr. Juengel on 10/09/23

## 2023-10-08 ENCOUNTER — Telehealth: Payer: Self-pay | Admitting: *Deleted

## 2023-10-08 ENCOUNTER — Encounter: Payer: Self-pay | Admitting: Radiation Oncology

## 2023-10-08 ENCOUNTER — Ambulatory Visit
Admission: RE | Admit: 2023-10-08 | Discharge: 2023-10-08 | Disposition: A | Discharge status: Home / Self Care | Payer: Medicare Other | Source: Ambulatory Visit | Attending: Radiation Oncology | Admitting: Radiation Oncology

## 2023-10-08 ENCOUNTER — Other Ambulatory Visit: Payer: Self-pay | Admitting: *Deleted

## 2023-10-08 VITALS — BP 140/74 | HR 82 | Temp 98.0°F | Resp 16 | Wt 170.0 lb

## 2023-10-08 DIAGNOSIS — R911 Solitary pulmonary nodule: Secondary | ICD-10-CM | POA: Diagnosis not present

## 2023-10-08 DIAGNOSIS — Z9221 Personal history of antineoplastic chemotherapy: Secondary | ICD-10-CM | POA: Diagnosis not present

## 2023-10-08 DIAGNOSIS — C099 Malignant neoplasm of tonsil, unspecified: Secondary | ICD-10-CM

## 2023-10-08 DIAGNOSIS — Z923 Personal history of irradiation: Secondary | ICD-10-CM | POA: Insufficient documentation

## 2023-10-08 NOTE — Progress Notes (Signed)
 Radiation Oncology Follow up Note  Name: Curtis Cooper   Date:   10/08/2023 MRN:  [id number] DOB: Apr 08, 1950    This 73 y.o. 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  Seen today in routine follow-up he is doing well he is having no head and neck pain or dysphagia.  He had a fungal infection of his tongue which has cleared nicely.  He had a recent CT scan of his chest and soft tissue of the head and neck..  There was a new 4 mm right middle lobe pulmonary nodule nonspecific with consideration of dedicated follow-up in 2 to 3 months.  No evidence of metastatic disease.  Soft tissue of the head and neck showed no evidence of recurrent disease in the region of the right tonsil and no evidence of adenopathy in the head and neck region.  COMPLICATIONS OF TREATMENT: none  FOLLOW UP COMPLIANCE: keeps appointments   PHYSICAL EXAM:  BP (!) 140/74   Pulse 82   Temp 98 F (36.7 C) (Tympanic)   Resp 16   Wt 170 lb (77.1 kg)   BMI 24.39 kg/m  Oral cavity is clear no oral mucosal lesions are identified no evidence of fungal infection of his tongue.  No evidence of cervical or supraclavicular adenopathy.  Well-developed well-nourished patient in NAD. HEENT reveals PERLA, EOMI, discs not visualized.  Oral cavity is clear. No oral mucosal lesions are identified. Neck is clear without evidence of cervical or supraclavicular adenopathy. Lungs are clear to A&P. Cardiac examination is essentially unremarkable with regular rate and rhythm without murmur rub or thrill. Abdomen is benign with no organomegaly or masses noted. Motor sensory and DTR  levels are equal and symmetric in the upper and lower extremities. Cranial nerves II through XII are grossly intact. Proprioception is intact. No peripheral adenopathy or edema is identified. No motor or sensory levels are noted. Crude visual fields are within normal range.  RADIOLOGY RESULTS: CT scans reviewed compatible with above-stated findings  PLAN: At the present time patient is doing well with no evidence of disease now out 10 months from concurrent chemoradiation.  Pleased with his overall progress.  I have asked to see him back in 4 months with a repeat CT scan of his chest to follow-up on the 4 mm right middle lobe nodule.  Patient continues close follow-up care with medical oncology and ENT.  Patient knows to call with any concerns.  I would like to take this opportunity to thank you for allowing me to participate in the care of your patient.SABRA Marcey Penton, MD

## 2023-10-08 NOTE — Telephone Encounter (Signed)
 The niece of the patient said that Dr lenn told the patient that they saw a 4 mm right middle lobe nonspecific. The patient does not think he can do chemo and radiation again. She wants to get scan sooner that 4 months so that it can spread to other areas. Is the node cancer. Last that the December 29 she is going out of town. She would like to have  it earlier 2- 3 days so she will be with him.  Bascom only wants the answers above and her telephone  [phone]

## 2023-10-09 NOTE — Telephone Encounter (Addendum)
 The niece called the unit and spoke with me. The niece is not listed as a person that can receive information  on the patient's HIPAA form. Writer informed niece that if she wanted to receive information she could have patient fill out another form.

## 2023-10-27 NOTE — Telephone Encounter (Signed)
 Call made to both mobile and home phone. No answer and unable to leave VM on either.   Please schedule port flush every 6-8 weeks until visit in Feb. LAST port flush was on 09/16/23. Please mail appts.

## 2023-11-11 ENCOUNTER — Inpatient Hospital Stay: Attending: Oncology

## 2023-12-12 ENCOUNTER — Encounter: Payer: Self-pay | Admitting: Oncology

## 2024-01-06 ENCOUNTER — Inpatient Hospital Stay

## 2024-01-12 ENCOUNTER — Telehealth: Payer: Self-pay | Admitting: Oncology

## 2024-01-12 NOTE — Telephone Encounter (Signed)
 Per secure chat pt had left vm to r/s missed port flush.  I called pt back and he is already scheduled for port flush on 12/15 the same day as his CT. Pt said he will keep this as it is. Date and time confirmed with pt

## 2024-01-21 ENCOUNTER — Telehealth: Payer: Self-pay | Admitting: Radiation Oncology

## 2024-01-21 NOTE — Telephone Encounter (Signed)
 Called pt to confirm CT date/time/location - pt didn't answer - vm is full - will call again to try to confirm - [facility]

## 2024-01-22 ENCOUNTER — Telehealth: Payer: Self-pay | Admitting: Radiation Oncology

## 2024-01-22 NOTE — Telephone Encounter (Signed)
 Called pt to confirm CT appt date/time/location since it's been a little bit since we last spoke - no answer - vm is full - will try again tomorrow (12/12) - [facility]

## 2024-01-26 ENCOUNTER — Inpatient Hospital Stay: Attending: Oncology

## 2024-01-26 ENCOUNTER — Encounter: Payer: Self-pay | Admitting: Oncology

## 2024-01-26 ENCOUNTER — Ambulatory Visit
Admission: RE | Admit: 2024-01-26 | Discharge: 2024-01-26 | Attending: Radiation Oncology | Admitting: Radiation Oncology

## 2024-01-26 DIAGNOSIS — G620 Drug-induced polyneuropathy: Secondary | ICD-10-CM | POA: Diagnosis not present

## 2024-01-26 DIAGNOSIS — N3289 Other specified disorders of bladder: Secondary | ICD-10-CM | POA: Diagnosis not present

## 2024-01-26 DIAGNOSIS — T451X5A Adverse effect of antineoplastic and immunosuppressive drugs, initial encounter: Secondary | ICD-10-CM | POA: Insufficient documentation

## 2024-01-26 DIAGNOSIS — I2510 Atherosclerotic heart disease of native coronary artery without angina pectoris: Secondary | ICD-10-CM | POA: Diagnosis not present

## 2024-01-26 DIAGNOSIS — R918 Other nonspecific abnormal finding of lung field: Secondary | ICD-10-CM | POA: Insufficient documentation

## 2024-01-26 DIAGNOSIS — C099 Malignant neoplasm of tonsil, unspecified: Secondary | ICD-10-CM | POA: Insufficient documentation

## 2024-01-26 DIAGNOSIS — Z9049 Acquired absence of other specified parts of digestive tract: Secondary | ICD-10-CM | POA: Diagnosis not present

## 2024-01-26 DIAGNOSIS — N400 Enlarged prostate without lower urinary tract symptoms: Secondary | ICD-10-CM | POA: Diagnosis not present

## 2024-01-26 DIAGNOSIS — I700 Atherosclerosis of aorta: Secondary | ICD-10-CM | POA: Diagnosis not present

## 2024-01-26 DIAGNOSIS — Z8619 Personal history of other infectious and parasitic diseases: Secondary | ICD-10-CM | POA: Diagnosis not present

## 2024-01-26 DIAGNOSIS — N62 Hypertrophy of breast: Secondary | ICD-10-CM | POA: Insufficient documentation

## 2024-01-26 DIAGNOSIS — Z79899 Other long term (current) drug therapy: Secondary | ICD-10-CM | POA: Insufficient documentation

## 2024-01-26 DIAGNOSIS — Z87891 Personal history of nicotine dependence: Secondary | ICD-10-CM | POA: Diagnosis not present

## 2024-01-26 DIAGNOSIS — Z87442 Personal history of urinary calculi: Secondary | ICD-10-CM | POA: Diagnosis not present

## 2024-01-26 DIAGNOSIS — I429 Cardiomyopathy, unspecified: Secondary | ICD-10-CM | POA: Diagnosis not present

## 2024-01-26 DIAGNOSIS — N644 Mastodynia: Secondary | ICD-10-CM | POA: Diagnosis not present

## 2024-01-26 MED ORDER — IOHEXOL 300 MG/ML  SOLN
100.0000 mL | Freq: Once | INTRAMUSCULAR | Status: AC | PRN
  Administered 2024-01-26: 10:00:00 100 mL via INTRAVENOUS

## 2024-01-26 MED ORDER — HEPARIN SOD (PORK) LOCK FLUSH 100 UNIT/ML IV SOLN
INTRAVENOUS | Status: AC
  Filled 2024-01-26: qty 5

## 2024-01-26 MED ORDER — HEPARIN SOD (PORK) LOCK FLUSH 100 UNIT/ML IV SOLN
500.0000 [IU] | Freq: Once | INTRAVENOUS | Status: AC
  Administered 2024-01-26: 10:00:00 500 [IU] via INTRAVENOUS

## 2024-01-26 NOTE — Patient Instructions (Signed)

## 2024-02-02 ENCOUNTER — Other Ambulatory Visit: Payer: Self-pay | Admitting: Cardiology

## 2024-02-02 DIAGNOSIS — Z8249 Family history of ischemic heart disease and other diseases of the circulatory system: Secondary | ICD-10-CM

## 2024-02-02 DIAGNOSIS — I2510 Atherosclerotic heart disease of native coronary artery without angina pectoris: Secondary | ICD-10-CM

## 2024-02-02 DIAGNOSIS — I5022 Chronic systolic (congestive) heart failure: Secondary | ICD-10-CM

## 2024-02-02 DIAGNOSIS — R7303 Prediabetes: Secondary | ICD-10-CM

## 2024-02-02 DIAGNOSIS — Z87891 Personal history of nicotine dependence: Secondary | ICD-10-CM
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
## 2024-02-03 ENCOUNTER — Encounter: Payer: Self-pay | Admitting: Oncology

## 2024-02-03 ENCOUNTER — Telehealth: Payer: Self-pay | Admitting: *Deleted

## 2024-02-03 NOTE — Telephone Encounter (Signed)
 Bascom Metro, patient's care giver called and would like to see if Dr. Babara could see the patient tomorrow  (if possible) to go over the CT scan results from 01/26/2024. She knows the lung nodules are growing since last CT scan in August. Pt has an apt on Monday 12/29 with Dr. Lenn, but Dr. JAYSON can not move up this apt.  Tonya is not able to come w/patient on Monday to the appointment with Dr. JAYSON as she is supposed to be boarding a flight to go out of town. She is nervous that he would be at his apt alone and would find out that his cancer has progressed without family support.  She stated that often the patient gets confused and does not relay the entire facts to the caregivers. She stated that he previously shared that the disease was stable but he never told us  that the cancer was possibly coming back.  Patient has his next apt with Dr. Babara in February. Caregiver specifically is asking if this apt can this be moved up sooner in the next few weeks than Feb or is it possible that you can see him tom.  She also sent a mychart msg.  Active listening provided to caregiver. I told her that I would send the team a message and would get back with her with the provider's suggestions on moving up the apt times.

## 2024-02-03 NOTE — Telephone Encounter (Signed)
 Patient also called triage.- see phone note sent to team.

## 2024-02-03 NOTE — Telephone Encounter (Signed)
 Per Dr. Babara, please schedule MD today or tomorrow. Please call Tonya with appt

## 2024-02-04 ENCOUNTER — Encounter: Payer: Self-pay | Admitting: Oncology

## 2024-02-04 ENCOUNTER — Inpatient Hospital Stay: Admitting: Oncology

## 2024-02-04 VITALS — BP 132/88 | HR 68 | Temp 97.2°F | Resp 18 | Wt 168.2 lb

## 2024-02-04 DIAGNOSIS — C099 Malignant neoplasm of tonsil, unspecified: Secondary | ICD-10-CM

## 2024-02-04 DIAGNOSIS — G620 Drug-induced polyneuropathy: Secondary | ICD-10-CM | POA: Diagnosis not present

## 2024-02-04 DIAGNOSIS — I429 Cardiomyopathy, unspecified: Secondary | ICD-10-CM | POA: Diagnosis not present

## 2024-02-04 DIAGNOSIS — T451X5A Adverse effect of antineoplastic and immunosuppressive drugs, initial encounter: Secondary | ICD-10-CM

## 2024-02-04 NOTE — Assessment & Plan Note (Signed)
 CAD, LVEF 40% follow up with cardiology.  On statin, losartan, Toprol

## 2024-02-04 NOTE — Assessment & Plan Note (Signed)
Grade 1, improved.  Observation.
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
## 2024-02-04 NOTE — Assessment & Plan Note (Signed)
 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  Increased size of 2 small lung nodules, stable sub capsular lesion in caudate lobe.  I will reviewed images on tumor board. Observation with short term CT follow up vs PET/biopsy if hypermetabolic.  Recommend patient to follow-up with ENT

## 2024-02-04 NOTE — Progress Notes (Signed)
Pt here for follow up and CT scan results

## 2024-02-09 ENCOUNTER — Other Ambulatory Visit: Payer: Self-pay | Admitting: *Deleted

## 2024-02-09 ENCOUNTER — Ambulatory Visit
Admission: RE | Admit: 2024-02-09 | Discharge: 2024-02-09 | Disposition: A | Discharge status: Home / Self Care | Source: Ambulatory Visit | Attending: Radiation Oncology | Admitting: Radiation Oncology

## 2024-02-09 VITALS — BP 158/88 | HR 69 | Temp 98.4°F | Resp 16 | Wt 170.0 lb

## 2024-02-09 DIAGNOSIS — C099 Malignant neoplasm of tonsil, unspecified: Secondary | ICD-10-CM

## 2024-02-09 DIAGNOSIS — K769 Liver disease, unspecified: Secondary | ICD-10-CM | POA: Insufficient documentation

## 2024-02-09 DIAGNOSIS — Z923 Personal history of irradiation: Secondary | ICD-10-CM | POA: Diagnosis not present

## 2024-02-09 NOTE — Progress Notes (Signed)
 Radiation Oncology Follow up Note  Name: Curtis Cooper   Date:   02/09/2024 MRN:  [id number] DOB: 06-Nov-1950    This 73 y.o. 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  Seen today in routine follow-up he is asymptomatic.  Specifically denies any head and neck pain dysphagia.SABRA  He recently had a CT scan of the head and neck which I have reviewed showing 2 irregular marginated solid nodules in the middle lobe and left lower lobe which have increased in size since prior study.  Also has a subcapsular ill-defined 8 x 11 mm hypoattenuating lesion in the caudate lobe of the liver.  CT scan of the soft tissue of the head and neck back in August showed no evidence of recurrent disease with no evidence of lymphadenopathy.  COMPLICATIONS OF TREATMENT: none  FOLLOW UP COMPLIANCE: keeps appointments   PHYSICAL EXAM:  BP (!) 158/88   Pulse 69   Temp 98.4 F (36.9 C) (Tympanic)   Resp 16   Wt 170 lb (77.1 kg)   BMI 24.39 kg/m  Oral cavity is clear no oral mucosal lesions are identified neck is clear without evidence of cervical adenopathy.  Well-developed well-nourished patient in NAD. HEENT reveals PERLA, EOMI, discs not visualized.  Oral cavity is clear. No oral mucosal lesions are identified. Neck is clear without evidence of cervical or supraclavicular adenopathy. Lungs are clear to A&P. Cardiac examination is essentially unremarkable with regular rate and rhythm without murmur rub or thrill. Abdomen is benign with no organomegaly or masses noted. Motor sensory and DTR levels are equal and symmetric in the upper and lower extremities. Cranial nerves II  through XII are grossly intact. Proprioception is intact. No peripheral adenopathy or edema is identified. No motor or sensory levels are noted. Crude visual fields are within normal range.  RADIOLOGY RESULTS: This time of ordered a PET scan.  Patient is anxious to know if he does have progressive disease and I believe a PET scan at this time would elucidate some of the recent CT findings in his chest and liver.  I will see him back about a week after the PET scan.  Should there be areas of hypermetabolic activity we will discuss potential biopsy and further workup.  Patient comprehensive recommendations well.  I would like to take this opportunity to thank you for allowing me to participate in the care of your patient.SABRA  PLAN: As above    Marcey Penton, MD

## 2024-02-13 ENCOUNTER — Encounter: Payer: Self-pay | Admitting: Oncology

## 2024-02-19 ENCOUNTER — Inpatient Hospital Stay: Attending: Oncology

## 2024-02-20 ENCOUNTER — Ambulatory Visit
Admission: RE | Admit: 2024-02-20 | Discharge: 2024-02-20 | Disposition: A | Source: Ambulatory Visit | Attending: Radiation Oncology | Admitting: Radiation Oncology

## 2024-02-20 ENCOUNTER — Encounter: Payer: Self-pay | Admitting: Oncology

## 2024-02-20 DIAGNOSIS — I700 Atherosclerosis of aorta: Secondary | ICD-10-CM | POA: Insufficient documentation

## 2024-02-20 DIAGNOSIS — N400 Enlarged prostate without lower urinary tract symptoms: Secondary | ICD-10-CM | POA: Insufficient documentation

## 2024-02-20 DIAGNOSIS — N62 Hypertrophy of breast: Secondary | ICD-10-CM | POA: Insufficient documentation

## 2024-02-20 DIAGNOSIS — C099 Malignant neoplasm of tonsil, unspecified: Secondary | ICD-10-CM | POA: Insufficient documentation

## 2024-02-20 DIAGNOSIS — R918 Other nonspecific abnormal finding of lung field: Secondary | ICD-10-CM | POA: Diagnosis not present

## 2024-02-20 DIAGNOSIS — J320 Chronic maxillary sinusitis: Secondary | ICD-10-CM | POA: Diagnosis not present

## 2024-02-20 DIAGNOSIS — Z9049 Acquired absence of other specified parts of digestive tract: Secondary | ICD-10-CM | POA: Diagnosis not present

## 2024-02-20 LAB — GLUCOSE, CAPILLARY: Glucose-Capillary: 120 mg/dL — ABNORMAL HIGH (ref 70–99)

## 2024-02-20 MED ORDER — FLUDEOXYGLUCOSE F - 18 (FDG) INJECTION
9.0200 | Freq: Once | INTRAVENOUS | Status: AC | PRN
  Administered 2024-02-20: 9.02 via INTRAVENOUS

## 2024-02-26 ENCOUNTER — Other Ambulatory Visit: Payer: Self-pay

## 2024-03-01 ENCOUNTER — Encounter: Payer: Self-pay | Admitting: Oncology

## 2024-03-02 ENCOUNTER — Encounter: Payer: Self-pay | Admitting: Radiation Oncology

## 2024-03-02 ENCOUNTER — Ambulatory Visit
Admission: RE | Admit: 2024-03-02 | Discharge: 2024-03-02 | Disposition: A | Discharge status: Home / Self Care | Source: Ambulatory Visit | Attending: Radiation Oncology | Admitting: Radiation Oncology

## 2024-03-02 ENCOUNTER — Inpatient Hospital Stay: Payer: Self-pay

## 2024-03-02 ENCOUNTER — Encounter (HOSPITAL_COMMUNITY): Payer: Self-pay

## 2024-03-02 VITALS — BP 124/68 | HR 62 | Temp 98.0°F | Resp 16 | Wt 177.4 lb

## 2024-03-02 DIAGNOSIS — C099 Malignant neoplasm of tonsil, unspecified: Secondary | ICD-10-CM | POA: Diagnosis present

## 2024-03-02 DIAGNOSIS — R911 Solitary pulmonary nodule: Secondary | ICD-10-CM | POA: Diagnosis not present

## 2024-03-02 DIAGNOSIS — Z9221 Personal history of antineoplastic chemotherapy: Secondary | ICD-10-CM | POA: Insufficient documentation

## 2024-03-02 DIAGNOSIS — Z923 Personal history of irradiation: Secondary | ICD-10-CM | POA: Insufficient documentation

## 2024-03-02 NOTE — Patient Instructions (Signed)

## 2024-03-02 NOTE — Progress Notes (Signed)
 Radiation Oncology Follow up Note  Name: Curtis Cooper   Date:   03/02/2024 MRN:  [id number] DOB: [birth year]    This 74 y.o. 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  Seen today in routine follow-up he is doing well.  Patient specifically [place] dysphagia head and neck pain.  I ran a PET scan on him which shows no evidence of activity in the head and neck area consistent with complete response clinically.  On PET scan there was an extremely small 8 x 6 mm right middle lobe nodule with an SUV of 3.2 suspicious for malignancy.  Also had a right infrahilar node with a maximum SUV of 4.8.  He is having no pulmonary symptoms.  COMPLICATIONS OF TREATMENT: none  FOLLOW UP COMPLIANCE: keeps appointments   PHYSICAL EXAM:  BP 124/68   Pulse 62   Temp 98 F (36.7 C) (Tympanic)   Resp 16   Wt 177 lb 6.4 oz (80.5 kg)   BMI 25.45 kg/m  Oral cavity is clear no oral mucosal lesions are identified neck is clear without evidence of adenopathy.  Well-developed well-nourished patient in NAD. HEENT reveals PERLA, EOMI, discs not visualized.  Oral cavity is clear. No oral mucosal lesions are identified. Neck is clear without evidence of cervical or supraclavicular adenopathy. Lungs are clear to A&P. Cardiac examination is essentially unremarkable with regular rate and rhythm without murmur rub or thrill. Abdomen is benign with no organomegaly or masses noted. Motor sensory and DTR levels are equal and symmetric in the upper and lower extremities. Cranial nerves II through XII are grossly intact. Proprioception is intact. No peripheral adenopathy or edema is  identified. No motor or sensory levels are noted. Crude visual fields are within normal range.  RADIOLOGY RESULTS: PET CT scan reviewed compatible with above-stated findings  PLAN: Present time he has had a complete response is clinically as far as his head and neck cancer is concerned.  Lesions in the longer soft extremely small at this time.  I am going to see him back in 4 months with a repeat CT scan of his chest.  Patient knows to call with any concerns.  Otherwise clinically he is doing well.  I would like to take this opportunity to thank you for allowing me to participate in the care of your patient.SABRA Marcey Penton, MD

## 2024-03-03 ENCOUNTER — Other Ambulatory Visit: Payer: Self-pay

## 2024-03-03 ENCOUNTER — Telehealth (HOSPITAL_COMMUNITY): Payer: Self-pay | Admitting: *Deleted

## 2024-03-03 NOTE — Telephone Encounter (Signed)
 Reaching out to patient to offer assistance regarding upcoming cardiac imaging study; pt verbalizes understanding of appt date/time, parking situation and where to check in, pre-test NPO status and medications ordered, and verified current allergies; name and call back number provided for further questions should they arise Sid Seats RN Navigator Cardiac Imaging [facility] [phone] office [phone] cell

## 2024-03-04 ENCOUNTER — Ambulatory Visit
Admission: RE | Admit: 2024-03-04 | Discharge: 2024-03-04 | Disposition: A | Discharge status: Home / Self Care | Source: Ambulatory Visit | Attending: Cardiology | Admitting: Cardiology

## 2024-03-04 DIAGNOSIS — R7303 Prediabetes: Secondary | ICD-10-CM | POA: Insufficient documentation

## 2024-03-04 DIAGNOSIS — Z87891 Personal history of nicotine dependence: Secondary | ICD-10-CM | POA: Diagnosis present

## 2024-03-04 DIAGNOSIS — I2584 Coronary atherosclerosis due to calcified coronary lesion: Secondary | ICD-10-CM | POA: Insufficient documentation

## 2024-03-04 DIAGNOSIS — I2510 Atherosclerotic heart disease of native coronary artery without angina pectoris: Secondary | ICD-10-CM | POA: Diagnosis present

## 2024-03-04 DIAGNOSIS — Z8249 Family history of ischemic heart disease and other diseases of the circulatory system: Secondary | ICD-10-CM | POA: Insufficient documentation

## 2024-03-04 DIAGNOSIS — I5022 Chronic systolic (congestive) heart failure: Secondary | ICD-10-CM | POA: Insufficient documentation

## 2024-03-04 MED ORDER — HEPARIN SOD (PORK) LOCK FLUSH 100 UNIT/ML IV SOLN
500.0000 [IU] | Freq: Once | INTRAVENOUS | Status: AC
  Administered 2024-03-04: 500 [IU] via INTRAVENOUS
  Filled 2024-03-04: qty 5

## 2024-03-04 MED ORDER — HEPARIN SOD (PORK) LOCK FLUSH 100 UNIT/ML IV SOLN
INTRAVENOUS | Status: AC
  Filled 2024-03-04: qty 5

## 2024-03-04 MED ORDER — NITROGLYCERIN 0.4 MG SL SUBL
0.8000 mg | SUBLINGUAL_TABLET | Freq: Once | SUBLINGUAL | Status: AC
  Administered 2024-03-04: 0.8 mg via SUBLINGUAL
  Filled 2024-03-04: qty 25

## 2024-03-04 MED ORDER — IOHEXOL 350 MG/ML SOLN
100.0000 mL | Freq: Once | INTRAVENOUS | Status: AC | PRN
  Administered 2024-03-04: 100 mL via INTRAVENOUS

## 2024-03-04 NOTE — Progress Notes (Signed)
 Patient tolerated procedure well. W/C to lobby.  Ambulate w/o difficulty. Denies light headedness or being dizzy. Encouraged to drink extra water today and reasoning explained. Verbalized understanding. All questions answered. ABC intact. No further needs. Discharge from procedure area w/o issues.

## 2024-03-18 ENCOUNTER — Inpatient Hospital Stay: Attending: Oncology | Admitting: Oncology

## 2024-03-18 ENCOUNTER — Encounter: Payer: Self-pay | Admitting: Oncology

## 2024-03-18 ENCOUNTER — Encounter: Payer: Self-pay | Admitting: Pulmonary Disease

## 2024-03-18 ENCOUNTER — Inpatient Hospital Stay: Attending: Oncology

## 2024-03-18 VITALS — BP 115/68 | HR 72 | Temp 97.5°F | Resp 18 | Wt 177.4 lb

## 2024-03-18 DIAGNOSIS — I429 Cardiomyopathy, unspecified: Secondary | ICD-10-CM

## 2024-03-18 DIAGNOSIS — G620 Drug-induced polyneuropathy: Secondary | ICD-10-CM | POA: Diagnosis not present

## 2024-03-18 DIAGNOSIS — T451X5A Adverse effect of antineoplastic and immunosuppressive drugs, initial encounter: Secondary | ICD-10-CM | POA: Diagnosis not present

## 2024-03-18 DIAGNOSIS — R911 Solitary pulmonary nodule: Secondary | ICD-10-CM | POA: Diagnosis not present

## 2024-03-18 DIAGNOSIS — C099 Malignant neoplasm of tonsil, unspecified: Secondary | ICD-10-CM

## 2024-03-18 DIAGNOSIS — Z95828 Presence of other vascular implants and grafts: Secondary | ICD-10-CM

## 2024-03-18 DIAGNOSIS — Z1329 Encounter for screening for other suspected endocrine disorder: Secondary | ICD-10-CM

## 2024-03-18 LAB — CBC WITH DIFFERENTIAL (CANCER CENTER ONLY)
Abs Immature Granulocytes: 0.02 10*3/uL (ref 0.00–0.07)
Basophils Absolute: 0 10*3/uL (ref 0.0–0.1)
Basophils Relative: 0 %
Eosinophils Absolute: 0.2 10*3/uL (ref 0.0–0.5)
Eosinophils Relative: 3 %
HCT: 41.2 % (ref 39.0–52.0)
Hemoglobin: 14.2 g/dL (ref 13.0–17.0)
Immature Granulocytes: 0 %
Lymphocytes Relative: 29 %
Lymphs Abs: 1.7 10*3/uL (ref 0.7–4.0)
MCH: 31 pg (ref 26.0–34.0)
MCHC: 34.5 g/dL (ref 30.0–36.0)
MCV: 90 fL (ref 80.0–100.0)
Monocytes Absolute: 0.5 10*3/uL (ref 0.1–1.0)
Monocytes Relative: 8 %
Neutro Abs: 3.4 10*3/uL (ref 1.7–7.7)
Neutrophils Relative %: 60 %
Platelet Count: 201 10*3/uL (ref 150–400)
RBC: 4.58 MIL/uL (ref 4.22–5.81)
RDW: 13.1 % (ref 11.5–15.5)
WBC Count: 5.7 10*3/uL (ref 4.0–10.5)
nRBC: 0 % (ref 0.0–0.2)

## 2024-03-18 LAB — TSH: TSH: 1.52 u[IU]/mL (ref 0.350–4.500)

## 2024-03-18 LAB — CMP (CANCER CENTER ONLY)
ALT: 23 U/L (ref 0–44)
AST: 24 U/L (ref 15–41)
Albumin: 4.3 g/dL (ref 3.5–5.0)
Alkaline Phosphatase: 85 U/L (ref 38–126)
Anion gap: 11 (ref 5–15)
BUN: 13 mg/dL (ref 8–23)
CO2: 23 mmol/L (ref 22–32)
Calcium: 9.5 mg/dL (ref 8.9–10.3)
Chloride: 107 mmol/L (ref 98–111)
Creatinine: 1.12 mg/dL (ref 0.61–1.24)
GFR, Estimated: >60 mL/min
Glucose, Bld: 126 mg/dL — ABNORMAL HIGH (ref 70–99)
Potassium: 4.2 mmol/L (ref 3.5–5.1)
Sodium: 140 mmol/L (ref 135–145)
Total Bilirubin: 0.3 mg/dL (ref 0.0–1.2)
Total Protein: 6.6 g/dL (ref 6.5–8.1)

## 2024-03-18 NOTE — Assessment & Plan Note (Addendum)
 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 Recommend patient to follow-up with ENT

## 2024-03-18 NOTE — Assessment & Plan Note (Signed)
Grade 1, improved.  Observation.

## 2024-03-18 NOTE — Assessment & Plan Note (Signed)
 Continue Mediport flush every 6 to 8 weeks

## 2024-03-18 NOTE — Assessment & Plan Note (Addendum)
 January 2026, PET scan showed hypermetabolic right middle lobe nodule 8 mm, hypermetabolic small right infrahilar lymph node.  Suspicious for neoplasm.  I referred patient to establish care with pulmonology for evaluation

## 2024-03-18 NOTE — Assessment & Plan Note (Addendum)
 CAD, LVEF 40% follow up with cardiology.
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
## 2024-04-02 ENCOUNTER — Ambulatory Visit: Admitting: Emergency Medicine

## 2024-05-06 ENCOUNTER — Inpatient Hospital Stay

## 2024-06-14 ENCOUNTER — Other Ambulatory Visit

## 2024-06-23 ENCOUNTER — Inpatient Hospital Stay: Admitting: Oncology

## 2024-06-23 ENCOUNTER — Inpatient Hospital Stay

## 2024-06-28 ENCOUNTER — Ambulatory Visit: Admitting: Radiation Oncology
